# Patient Record
Sex: Female | Born: 1955
Health system: Southern US, Community
[De-identification: ages and names within clinical notes are randomized; demographics above are authoritative.]

## PROBLEM LIST (undated history)

## (undated) DIAGNOSIS — T7840XA Allergy, unspecified, initial encounter: Secondary | ICD-10-CM

## (undated) DIAGNOSIS — K5790 Diverticulosis of intestine, part unspecified, without perforation or abscess without bleeding: Secondary | ICD-10-CM

## (undated) DIAGNOSIS — M1990 Unspecified osteoarthritis, unspecified site: Secondary | ICD-10-CM

## (undated) DIAGNOSIS — R87619 Unspecified abnormal cytological findings in specimens from cervix uteri: Secondary | ICD-10-CM

## (undated) DIAGNOSIS — K635 Polyp of colon: Secondary | ICD-10-CM

## (undated) HISTORY — DX: Diverticulosis of intestine, part unspecified, without perforation or abscess without bleeding: K57.90

## (undated) HISTORY — DX: Unspecified abnormal cytological findings in specimens from cervix uteri: R87.619

## (undated) HISTORY — DX: Allergy, unspecified, initial encounter: T78.40XA

## (undated) HISTORY — DX: Polyp of colon: K63.5

## (undated) HISTORY — PX: COLONOSCOPY: SHX174

---

## 1979-06-23 HISTORY — PX: AUGMENTATION MAMMAPLASTY: SUR837

## 1984-06-22 DIAGNOSIS — R87619 Unspecified abnormal cytological findings in specimens from cervix uteri: Secondary | ICD-10-CM

## 1984-06-22 HISTORY — DX: Unspecified abnormal cytological findings in specimens from cervix uteri: R87.619

## 1993-06-22 HISTORY — PX: ABDOMINAL HYSTERECTOMY: SHX81

## 2006-11-29 ENCOUNTER — Ambulatory Visit: Payer: Self-pay | Admitting: Internal Medicine

## 2006-12-13 ENCOUNTER — Ambulatory Visit: Payer: Self-pay | Admitting: Internal Medicine

## 2006-12-13 ENCOUNTER — Encounter: Payer: Self-pay | Admitting: Internal Medicine

## 2011-10-16 ENCOUNTER — Encounter: Payer: Self-pay | Admitting: Internal Medicine

## 2011-12-01 ENCOUNTER — Other Ambulatory Visit: Payer: Self-pay | Admitting: Obstetrics and Gynecology

## 2012-01-26 ENCOUNTER — Other Ambulatory Visit: Payer: Self-pay | Admitting: Urology

## 2012-02-25 ENCOUNTER — Encounter (HOSPITAL_COMMUNITY): Payer: Self-pay | Admitting: Pharmacy Technician

## 2012-03-01 ENCOUNTER — Telehealth: Payer: Self-pay | Admitting: Internal Medicine

## 2012-03-01 DIAGNOSIS — Z1211 Encounter for screening for malignant neoplasm of colon: Secondary | ICD-10-CM

## 2012-03-01 MED ORDER — PEG-KCL-NACL-NASULF-NA ASC-C 100 G PO SOLR: 1.0000 | Freq: Once | ORAL | Status: DC

## 2012-03-01 NOTE — Telephone Encounter (Signed)
Pt aware and scheduled for colon 03/03/12@8 :30am. Pt to come in for procedure instructions this afternoon.

## 2012-03-01 NOTE — Telephone Encounter (Signed)
Pt was due for colon recall in June. Pt is scheduled to have surgery 9/17 to repair her collapsed vaginal wall and a bladder tack. Pt states her PCP mentioned to her that she should have her colon done prior this surgery. Pt wanted to see if Dr. Marina Goodell thought she should have the colon prior to the procedure to wait until after the surgery. Dr. Marina Goodell please advise.

## 2012-03-01 NOTE — Telephone Encounter (Signed)
Probably best prior to surgery, especially if she is due for followup

## 2012-03-01 NOTE — Addendum Note (Signed)
Addended by: Selinda Michaels R on: 03/01/2012 01:58 PM   Modules accepted: Orders

## 2012-03-01 NOTE — Telephone Encounter (Signed)
Pt came in for prep instruction for colon, moviprep rx sent to pharmacy.

## 2012-03-02 ENCOUNTER — Encounter (HOSPITAL_COMMUNITY)
Admission: RE | Admit: 2012-03-02 | Discharge: 2012-03-02 | Disposition: A | Discharge status: Home / Self Care | Payer: BC Managed Care – PPO | Source: Ambulatory Visit | Attending: Urology | Admitting: Urology

## 2012-03-02 ENCOUNTER — Encounter (HOSPITAL_COMMUNITY): Payer: Self-pay

## 2012-03-02 HISTORY — DX: Unspecified osteoarthritis, unspecified site: M19.90

## 2012-03-02 LAB — CBC
Hemoglobin: 13.4 g/dL (ref 12.0–15.0)
MCH: 30 pg (ref 26.0–34.0)
MCHC: 33.4 g/dL (ref 30.0–36.0)
Platelets: 221 10*3/uL (ref 150–400)
RDW: 12.8 % (ref 11.5–15.5)

## 2012-03-02 LAB — SURGICAL PCR SCREEN
MRSA, PCR: NEGATIVE
Staphylococcus aureus: NEGATIVE

## 2012-03-02 LAB — PROTIME-INR: Prothrombin Time: 13.2 seconds (ref 11.6–15.2)

## 2012-03-02 NOTE — Progress Notes (Signed)
Abnormal PTT faxed to Dr. Sherron Monday - confirmation recieved

## 2012-03-02 NOTE — Patient Instructions (Addendum)
19 Karen Todd  03/02/2012   Your procedure is scheduled on: 03/08/12 AT 7:30 AM  Report to SHORT STAY DEPT  At  5:30  AM.  Call this number if you have problems the morning of surgery: [phone]   Remember:   Do not eat food or drink liquids AFTER MIDNIGHT    Take these medicines the morning of surgery with A SIP OF WATER:   Do not wear jewelry, make-up or nail polish.  Do not wear lotions, powders, or perfumes.   Do not shave legs or underarms 48 hrs. before surgery (men may shave face)  Do not bring valuables to the hospital.  Contacts, dentures or bridgework may not be worn into surgery.  Leave suitcase in the car. After surgery it may be brought to your room.  For patients admitted to the hospital, checkout time is 11:00 AM the day of discharge.   Patients discharged the day of surgery will not be allowed to drive home. If going home same day of surgery, must have someone stay with you first 24 hrs at home and arrange for some one to drive you home from hospital.    Special Instructions:   Please read over the following fact sheets that you were given: MRSA  Information               SHOWER WITH BETASEPT THE NIGHT BEFORE SURGERY AND THE MORNING OF SURGERY             FLEET ENEMA THE NIGHT BEFORE SURGERY         X_______________________________________________________________

## 2012-03-03 ENCOUNTER — Encounter: Payer: Self-pay | Admitting: Internal Medicine

## 2012-03-03 ENCOUNTER — Ambulatory Visit (AMBULATORY_SURGERY_CENTER): Payer: BC Managed Care – PPO | Admitting: Internal Medicine

## 2012-03-03 VITALS — BP 127/79 | HR 73 | Temp 98.0°F | Resp 16 | Ht 65.0 in | Wt 180.0 lb

## 2012-03-03 DIAGNOSIS — D126 Benign neoplasm of colon, unspecified: Secondary | ICD-10-CM

## 2012-03-03 DIAGNOSIS — Z1211 Encounter for screening for malignant neoplasm of colon: Secondary | ICD-10-CM

## 2012-03-03 DIAGNOSIS — Z86010 Personal history of colonic polyps: Secondary | ICD-10-CM

## 2012-03-03 MED ORDER — SODIUM CHLORIDE 0.9 % IV SOLN: 500.0000 mL | INTRAVENOUS | Status: DC

## 2012-03-03 NOTE — Progress Notes (Addendum)
Patient did not have preoperative order for IV antibiotic SSI prophylaxis. (G8918)  Patient did not experience any of the following events: a burn prior to discharge; a fall within the facility; wrong site/side/patient/procedure/implant event; or a hospital transfer or hospital admission upon discharge from the facility. (G8907)  

## 2012-03-03 NOTE — Op Note (Signed)
[facility] [address] [place], [address]   COLONOSCOPY PROCEDURE REPORT  PATIENT: Karen Todd, Karen Todd  MR#: [id number] BIRTHDATE: 1956/05/24 , 56  yrs. old GENDER: Female ENDOSCOPIST: Roxy Cedar, MD REFERRED NW:[id number] Program Recall PROCEDURE DATE:  03/03/2012 PROCEDURE:   Colonoscopy with snare polypectomy    x 1 ASA CLASS:   Class II INDICATIONS:high risk screening; patient's personal history of adenomatous colon polyps. Index 11-2006 w/ TA MEDICATIONS: MAC sedation, administered by CRNA and propofol (Diprivan) 300mg  IV  DESCRIPTION OF PROCEDURE:   After the risks benefits and alternatives of the procedure were thoroughly explained, informed consent was obtained.  A digital rectal exam revealed no abnormalities of the rectum.   The LB CF-[id number]  endoscope was introduced through the anus and advanced to the cecum, which was identified by both the appendix and ileocecal valve. No adverse events experienced.   The quality of the prep was good, using MoviPrep  The instrument was then slowly withdrawn as the colon was fully examined.      COLON FINDINGS: A diminutive polyp was found in the descending colon.  A polypectomy was performed with a cold snare.  The resection was complete and the polyp tissue was completely retrieved.   Moderate diverticulosis was noted in the descending colon and sigmoid colon.   The colon mucosa was otherwise normal. Retroflexed views revealed internal hemorrhoids. The time to cecum=3 minutes 36 seconds.  Withdrawal time=10 minutes 37 seconds. The scope was withdrawn and the procedure completed. COMPLICATIONS: There were no complications.  ENDOSCOPIC IMPRESSION: 1.   Diminutive polyp was found in the descending colon; polypectomy was performed with a cold snare 2.   Moderate diverticulosis was noted in the descending colon and sigmoid colon 3.   The colon mucosa was otherwise normal  RECOMMENDATIONS: 1.  Follow up colonoscopy in 5 years   eSigned:  Roxy Cedar, MD 03/03/2012 9:32 AM   cc: Geoffry Paradise, MD and The Patient   PATIENT NAME:  Karen Todd, Karen Todd MR#: [id number]

## 2012-03-03 NOTE — Patient Instructions (Addendum)
YOU HAD AN ENDOSCOPIC PROCEDURE TODAY AT THE [facility]: Refer to the procedure report that was given to you for any specific questions about what was found during the examination.  If the procedure report does not answer your questions, please call your gastroenterologist to clarify.  If you requested that your care partner not be given the details of your procedure findings, then the procedure report has been included in a sealed envelope for you to review at your convenience later.  YOU SHOULD EXPECT: Some feelings of bloating in the abdomen. Passage of more gas than usual.  Walking can help get rid of the air that was put into your GI tract during the procedure and reduce the bloating. If you had a lower endoscopy (such as a colonoscopy or flexible sigmoidoscopy) you may notice spotting of blood in your stool or on the toilet paper. If you underwent a bowel prep for your procedure, then you may not have a normal bowel movement for a few days.  DIET: Your first meal following the procedure should be a light meal and then it is ok to progress to your normal diet.  A half-sandwich or bowl of soup is an example of a good first meal.  Heavy or fried foods are harder to digest and may make you feel nauseous or bloated.  Likewise meals heavy in dairy and vegetables can cause extra gas to form and this can also increase the bloating.  Drink plenty of fluids but you should avoid alcoholic beverages for 24 hours.  ACTIVITY: Your care partner should take you home directly after the procedure.  You should plan to take it easy, moving slowly for the rest of the day.  You can resume normal activity the day after the procedure however you should NOT DRIVE or use heavy machinery for 24 hours (because of the sedation medicines used during the test).    SYMPTOMS TO REPORT IMMEDIATELY: A gastroenterologist can be reached at any hour.  During normal business hours, 8:30 AM to 5:00 PM Monday through Friday,  call [phone].  After hours and on weekends, please call the GI answering service at [phone] who will take a message and have the physician on call contact you.   Following lower endoscopy (colonoscopy or flexible sigmoidoscopy):  Excessive amounts of blood in the stool  Significant tenderness or worsening of abdominal pains  Swelling of the abdomen that is new, acute  Fever of 100F or higher  FOLLOW UP: If any biopsies were taken you will be contacted by phone or by letter within the next 1-3 weeks.  Call your gastroenterologist if you have not heard about the biopsies in 3 weeks.  Our staff will call the home number listed on your records the next business day following your procedure to check on you and address any questions or concerns that you may have at that time regarding the information given to you following your procedure. This is a courtesy call and so if there is no answer at the home number and we have not heard from you through the emergency physician on call, we will assume that you have returned to your regular daily activities without incident.  SIGNATURES/CONFIDENTIALITY: You and/or your care partner have signed paperwork which will be entered into your electronic medical record.  These signatures attest to the fact that that the information above on your After Visit Summary has been reviewed and is understood.  Full responsibility of the confidentiality of this   discharge information lies with you and/or your care-partner.   Thank-you for choosing us for your healthcare needs. 

## 2012-03-04 ENCOUNTER — Telehealth: Payer: Self-pay | Admitting: *Deleted

## 2012-03-04 NOTE — Telephone Encounter (Signed)
  Follow up Call-  Call back number 03/03/2012  Post procedure Call Back phone  # [phone]  Permission to leave phone message Yes     Patient questions:  Do you have a fever, pain , or abdominal swelling? no Pain Score  0 *  Have you tolerated food without any problems? yes  Have you been able to return to your normal activities? yes  Do you have any questions about your discharge instructions: Diet   no Medications  no Follow up visit  no  Do you have questions or concerns about your Care? no  Actions: * If pain score is 4 or above: No action needed, pain <4.

## 2012-03-07 MED ORDER — GENTAMICIN SULFATE 40 MG/ML IJ SOLN
400.0000 mg | INTRAVENOUS | Status: DC
  Filled 2012-03-07: qty 10

## 2012-03-07 MED ORDER — GENTAMICIN SULFATE 40 MG/ML IJ SOLN
400.0000 mg | INTRAVENOUS | Status: AC
  Administered 2012-03-08: 400 mg via INTRAVENOUS
  Filled 2012-03-07 (×2): qty 10

## 2012-03-07 NOTE — H&P (Signed)
History of Present Illness   [name] has symptomatic pelvic organ prolapse. She leaks with a sneeze and she used to leak with urgency. She has mild frequency and mild nocturia. She has a pinching feeling in her lower abdomen. She leans to her left to double void a small amount. With the vault and posterior wall reduced she had a grade 1 cystocele. She had a moderate grade 2 rectocele and possibly an enterocele. Her vaginal cuff lost 3 or 4 cm of length. She emptied efficiently. She was here to discuss urodynamics. I thought if she had surgery she likely would best benefit from a transvaginal vault suspension, posterior repair and/or enterocele repair. I did not think her pain was likely related to her prolapse.  Review of systems: No change in bowel or neurologic status.   On urodynamics she emptied efficiently and was catheterized for 30 mL. Her maximum capacity was 633 mL. She had mild instability reaching a pressure of 9 cm of water and did not leak. She had mild leakage with a Valsalva leak-point pressure of 65 cm of water. There was moderate leakage at 76 cm of water with the prolapse reduced. During voluntary voiding she was generating some voluntary contractions of low pressure before initiating urination. She then voided 429 mL with a maximum flow of 24 mL per second. Maximum voiding pressure was 28 cm of water. Her contractions were not that well sustained. She had difficulty voiding in the lab setting. She said it was difficult with the vaginal pack in place to void and that the lab setting was more difficult for her. EMG activity increased during the voiding phase. Bladder neck descended 2-3 cm and she had little to no cystocele. The details of the urodynamics are signed and dictated on the urodynamic sheet.    Surgical History Problems  1. History of  Hysterectomy V45.77  Current Meds 1. No Reported Medications  Allergies Medication  1. No Known Drug Allergies  Family  History Problems  1. Paternal history of  Death In The Family Father father passed @ age 35stroke 2. Maternal history of  Death In The Family Mother mother passed @ age 78pneumonia 3. Family history of  Family Health Status Number Of Children 1 son1 daughter 4. Paternal history of  Stroke Syndrome V17.1 5. Paternal grandmother's history of  Tuberculosis 1920's  Social History Problems  1. Alcohol Use glass of wine every other week 2. Caffeine Use 2 cups of coffee, tea 3. Marital History - Currently Married 4. Never A Smoker 5. 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 History of  Tobacco Use  Assessment Assessed  1. Rectocele 596.89 2. Female Stress Incontinence 625.6  Plan Rectocele (596.89)  1. Follow-up Schedule Surgery Office  Follow-up  Done: 01Aug2013  Discussion/Summary   I drew Karen Todd a picture. I went over watchful waiting versus pessary versus a vault suspension and posterior repair and intraoperative inspection for enterocele.   I drew her a picture and we talked about prolapse surgery in detail. Pros, cons, general surgical and anesthetic risks, and other options including behavioral therapy, pessaries, and watchful waiting were discussed. She understands that prolapse repairs are successful in 80-85% of cases for prolapse symptoms and can recur anteriorly, posteriorly, and/or apically. She understands that in most cases I use a graft and general risks were discussed. Surgical risks were described but not limited to the discussion of injury to neighboring structures including the bowel (with possible life-threatening sepsis and colostomy), bladder, urethra,  vagina (all resulting in further surgery), and ureter (resulting in re-implantation). We talked about injury to nerves/soft tissue leading to debilitating and intractable pelvic, abdominal, and lower extremity pain syndromes and neuropathies. The risks of buttock pain, intractable dyspareunia, and  vaginal narrowing and shortening with sequelae were discussed. Bleeding risks, transfusion rates, and infection were discussed. The risk of persistent, de novo, or worsening bladder and/or bowel incontinence/dysfunction was discussed. The need for CIC was described as well the usual post-operative course. The patient understands that she might not reach her treatment goal and that she might be worse following surgery.  I set up goals regarding abdominal discomfort.   I talked to her about watchful waiting versus behavioral therapy versus sling for her mild stress incontinence.   We talked about a sling in detail. Pros, cons, general surgical and anesthetic risks, and other options including behavioral therapy and watchful waiting were discussed. She understands that slings are generally successful in 90% of cases for stress incontinence, 50% for urge incontinence, and that in a small percentage of cases the incontinence can worsen. The risk of persistent, de novo, or worsening incontinence/dysfunction was discussed. Risks were described but not limited to the discussion of injury to neighboring structures including the bowel (with possible life-threatening sepsis and colostomy), bladder, urethra, vagina (all resulting in further surgery), and ureter (resulting in re-implantation). We also talked about the risk of retention requiring urethrolysis, extrusion requiring revision, and erosion resulting in further surgery. Bleeding risks and transfusion rates and the risk of infection were discussed. The risk of pelvic and abdominal pain syndromes, dyspareunia, and neuropathies were discussed. The need for CIC was described as well the usual post-operative course. The patient understands that she might not reach her treatment goal and that she might be worse following surgery.  She has had a previous bladder suspension and hysterectomy years ago.   Karen Todd currently is not sexually active. She is a little bit  worried about Staph infection and this was discussed with her. She had a suprapubic tube in the past and if she ever had surgery for her stress incontinence I consented her for [place].  She does not wish to proceed with a sling. She normally does not wear a pad. She would like to proceed with the vault suspension and posterior repair.   After a thorough review of the management options for the patient's condition the patient  elected to proceed with surgical therapy as noted above. We have discussed the potential benefits and risks of the procedure, side effects of the proposed treatment, the likelihood of the patient achieving the goals of the procedure, and any potential problems that might occur during the procedure or recuperation. Informed consent has been obtained.

## 2012-03-08 ENCOUNTER — Encounter (HOSPITAL_COMMUNITY): Payer: Self-pay | Admitting: Anesthesiology

## 2012-03-08 ENCOUNTER — Observation Stay (HOSPITAL_COMMUNITY)
Admission: RE | Admit: 2012-03-08 | Discharge: 2012-03-09 | DRG: 356 | Disposition: A | Discharge status: Home / Self Care | Payer: BC Managed Care – PPO | Source: Ambulatory Visit | Attending: Urology | Admitting: Urology

## 2012-03-08 ENCOUNTER — Encounter: Payer: Self-pay | Admitting: Internal Medicine

## 2012-03-08 ENCOUNTER — Ambulatory Visit (HOSPITAL_COMMUNITY): Payer: BC Managed Care – PPO | Admitting: Anesthesiology

## 2012-03-08 ENCOUNTER — Encounter (HOSPITAL_COMMUNITY): Payer: Self-pay | Admitting: *Deleted

## 2012-03-08 ENCOUNTER — Encounter (HOSPITAL_COMMUNITY)
Admission: RE | Disposition: A | Discharge status: Home / Self Care | Payer: Self-pay | Source: Ambulatory Visit | Attending: Urology

## 2012-03-08 DIAGNOSIS — R112 Nausea with vomiting, unspecified: Secondary | ICD-10-CM | POA: Insufficient documentation

## 2012-03-08 DIAGNOSIS — N8110 Cystocele, unspecified: Principal | ICD-10-CM | POA: Insufficient documentation

## 2012-03-08 DIAGNOSIS — N393 Stress incontinence (female) (male): Secondary | ICD-10-CM | POA: Diagnosis present

## 2012-03-08 DIAGNOSIS — N816 Rectocele: Secondary | ICD-10-CM | POA: Insufficient documentation

## 2012-03-08 DIAGNOSIS — Z01812 Encounter for preprocedural laboratory examination: Secondary | ICD-10-CM

## 2012-03-08 DIAGNOSIS — Z9079 Acquired absence of other genital organ(s): Secondary | ICD-10-CM | POA: Insufficient documentation

## 2012-03-08 HISTORY — PX: RECTOCELE REPAIR: SHX761

## 2012-03-08 HISTORY — PX: CYSTOSCOPY: SHX5120

## 2012-03-08 HISTORY — PX: VAGINAL PROLAPSE REPAIR: SHX830

## 2012-03-08 LAB — BASIC METABOLIC PANEL
CO2: 27 mEq/L (ref 19–32)
Calcium: 8.7 mg/dL (ref 8.4–10.5)
Chloride: 99 mEq/L (ref 96–112)
Creatinine, Ser: 0.8 mg/dL (ref 0.50–1.10)
GFR calc Af Amer: >90 mL/min (ref >90)
Sodium: 134 mEq/L — ABNORMAL LOW (ref 135–145)

## 2012-03-08 LAB — ABO/RH: ABO/RH(D): O NEG

## 2012-03-08 LAB — TYPE AND SCREEN

## 2012-03-08 LAB — HEMOGLOBIN AND HEMATOCRIT, BLOOD
HCT: 37.5 % (ref 36.0–46.0)
Hemoglobin: 13 g/dL (ref 12.0–15.0)

## 2012-03-08 SURGERY — CYSTOSCOPY
Anesthesia: General

## 2012-03-08 MED ORDER — SODIUM CHLORIDE 0.9 % IR SOLN
Status: DC | PRN
  Administered 2012-03-08: 08:00:00

## 2012-03-08 MED ORDER — CEFAZOLIN SODIUM-DEXTROSE 2-3 GM-% IV SOLR
INTRAVENOUS | Status: AC
  Filled 2012-03-08: qty 50

## 2012-03-08 MED ORDER — DEXAMETHASONE SODIUM PHOSPHATE 4 MG/ML IJ SOLN
INTRAMUSCULAR | Status: DC | PRN
  Administered 2012-03-08: 10 mg via INTRAVENOUS

## 2012-03-08 MED ORDER — EPHEDRINE SULFATE 50 MG/ML IJ SOLN
INTRAMUSCULAR | Status: DC | PRN
  Administered 2012-03-08 (×2): 5 mg via INTRAVENOUS
  Administered 2012-03-08: 10 mg via INTRAVENOUS

## 2012-03-08 MED ORDER — ONDANSETRON HCL 4 MG/2ML IJ SOLN
INTRAMUSCULAR | Status: DC | PRN
  Administered 2012-03-08 (×2): 2 mg via INTRAVENOUS

## 2012-03-08 MED ORDER — ONDANSETRON HCL 4 MG/2ML IJ SOLN
4.0000 mg | Freq: Four times a day (QID) | INTRAMUSCULAR | Status: DC
  Administered 2012-03-08 – 2012-03-09 (×3): 4 mg via INTRAVENOUS
  Filled 2012-03-08 (×3): qty 2

## 2012-03-08 MED ORDER — HYDROMORPHONE HCL PF 1 MG/ML IJ SOLN: 0.2500 mg | INTRAMUSCULAR | Status: DC | PRN

## 2012-03-08 MED ORDER — ESTRADIOL 0.1 MG/GM VA CREA
TOPICAL_CREAM | VAGINAL | Status: DC | PRN
  Administered 2012-03-08: 1 via VAGINAL

## 2012-03-08 MED ORDER — HYDROCODONE-ACETAMINOPHEN 5-325 MG PO TABS: 1.0000 | ORAL_TABLET | ORAL | Status: DC | PRN

## 2012-03-08 MED ORDER — LIDOCAINE-EPINEPHRINE (PF) 1 %-1:200000 IJ SOLN
INTRAMUSCULAR | Status: AC
  Filled 2012-03-08: qty 20

## 2012-03-08 MED ORDER — HYDROMORPHONE HCL PF 1 MG/ML IJ SOLN
INTRAMUSCULAR | Status: DC | PRN
  Administered 2012-03-08: 0.5 mg via INTRAVENOUS

## 2012-03-08 MED ORDER — MIDAZOLAM HCL 5 MG/5ML IJ SOLN
INTRAMUSCULAR | Status: DC | PRN
  Administered 2012-03-08: 1 mg via INTRAVENOUS

## 2012-03-08 MED ORDER — INDIGOTINDISULFONATE SODIUM 8 MG/ML IJ SOLN
INTRAMUSCULAR | Status: AC
  Filled 2012-03-08: qty 10

## 2012-03-08 MED ORDER — KETOROLAC TROMETHAMINE 30 MG/ML IJ SOLN: 15.0000 mg | Freq: Once | INTRAMUSCULAR | Status: DC | PRN

## 2012-03-08 MED ORDER — ACETAMINOPHEN 10 MG/ML IV SOLN
INTRAVENOUS | Status: AC
  Filled 2012-03-08: qty 100

## 2012-03-08 MED ORDER — GLYCOPYRROLATE 0.2 MG/ML IJ SOLN
INTRAMUSCULAR | Status: DC | PRN
  Administered 2012-03-08: 0.4 mg via INTRAVENOUS

## 2012-03-08 MED ORDER — LACTATED RINGERS IV SOLN
INTRAVENOUS | Status: DC | PRN
  Administered 2012-03-08 (×2): via INTRAVENOUS

## 2012-03-08 MED ORDER — KCL IN DEXTROSE-NACL 10-5-0.45 MEQ/L-%-% IV SOLN
INTRAVENOUS | Status: DC
  Administered 2012-03-08 – 2012-03-09 (×3): via INTRAVENOUS
  Filled 2012-03-08 (×5): qty 1000

## 2012-03-08 MED ORDER — CEFAZOLIN SODIUM-DEXTROSE 2-3 GM-% IV SOLR
2.0000 g | INTRAVENOUS | Status: AC
  Administered 2012-03-08: 2 g via INTRAVENOUS

## 2012-03-08 MED ORDER — PROPOFOL 10 MG/ML IV EMUL
INTRAVENOUS | Status: DC | PRN
  Administered 2012-03-08: 200 mg via INTRAVENOUS

## 2012-03-08 MED ORDER — LIDOCAINE HCL (CARDIAC) 20 MG/ML IV SOLN
INTRAVENOUS | Status: DC | PRN
  Administered 2012-03-08: 30 mg via INTRAVENOUS

## 2012-03-08 MED ORDER — CISATRACURIUM BESYLATE (PF) 10 MG/5ML IV SOLN
INTRAVENOUS | Status: DC | PRN
  Administered 2012-03-08: 1 mg via INTRAVENOUS
  Administered 2012-03-08: 6 mg via INTRAVENOUS
  Administered 2012-03-08: 1 mg via INTRAVENOUS

## 2012-03-08 MED ORDER — FLEET ENEMA 7-19 GM/118ML RE ENEM: 1.0000 | ENEMA | Freq: Once | RECTAL | Status: DC

## 2012-03-08 MED ORDER — MORPHINE SULFATE 2 MG/ML IJ SOLN
2.0000 mg | INTRAMUSCULAR | Status: DC | PRN
  Administered 2012-03-08 (×2): 2 mg via INTRAVENOUS
  Filled 2012-03-08 (×2): qty 1

## 2012-03-08 MED ORDER — PROMETHAZINE HCL 25 MG/ML IJ SOLN: 6.2500 mg | INTRAMUSCULAR | Status: DC | PRN

## 2012-03-08 MED ORDER — ACETAMINOPHEN 10 MG/ML IV SOLN
1000.0000 mg | Freq: Four times a day (QID) | INTRAVENOUS | Status: AC
  Administered 2012-03-08 – 2012-03-09 (×4): 1000 mg via INTRAVENOUS
  Filled 2012-03-08 (×4): qty 100

## 2012-03-08 MED ORDER — STERILE WATER FOR IRRIGATION IR SOLN
Status: DC | PRN
  Administered 2012-03-08: 3000 mL via INTRAVESICAL

## 2012-03-08 MED ORDER — LIDOCAINE-EPINEPHRINE (PF) 1 %-1:200000 IJ SOLN
INTRAMUSCULAR | Status: DC | PRN
  Administered 2012-03-08: 18 mL

## 2012-03-08 MED ORDER — FENTANYL CITRATE 0.05 MG/ML IJ SOLN
INTRAMUSCULAR | Status: DC | PRN
  Administered 2012-03-08: 25 ug via INTRAVENOUS
  Administered 2012-03-08: 75 ug via INTRAVENOUS

## 2012-03-08 MED ORDER — INDIGOTINDISULFONATE SODIUM 8 MG/ML IJ SOLN
INTRAMUSCULAR | Status: DC | PRN
  Administered 2012-03-08: 5 mL via INTRAVENOUS

## 2012-03-08 MED ORDER — SUCCINYLCHOLINE CHLORIDE 20 MG/ML IJ SOLN
INTRAMUSCULAR | Status: DC | PRN
  Administered 2012-03-08: 100 mg via INTRAVENOUS

## 2012-03-08 MED ORDER — ESTRADIOL 0.1 MG/GM VA CREA
TOPICAL_CREAM | VAGINAL | Status: AC
Start: 2012-03-08 — End: 2012-03-08
  Filled 2012-03-08: qty 85

## 2012-03-08 MED ORDER — NEOSTIGMINE METHYLSULFATE 1 MG/ML IJ SOLN
INTRAMUSCULAR | Status: DC | PRN
  Administered 2012-03-08: 3 mg via INTRAVENOUS

## 2012-03-08 MED ORDER — ACETAMINOPHEN 10 MG/ML IV SOLN
INTRAVENOUS | Status: DC | PRN
  Administered 2012-03-08: 1000 mg via INTRAVENOUS

## 2012-03-08 SURGICAL SUPPLY — 59 items
ADH SKN CLS APL DERMABOND .7 (GAUZE/BANDAGES/DRESSINGS)
BAG URINE DRAINAGE (UROLOGICAL SUPPLIES) ×2 IMPLANT
BAG URO CATCHER STRL LF (DRAPE) ×1 IMPLANT
BLADE HEX COATED 2.75 (ELECTRODE) ×2 IMPLANT
BLADE SURG 15 STRL LF DISP TIS (BLADE) ×2 IMPLANT
BLADE SURG 15 STRL SS (BLADE) ×4
BRIEF STRETCH FOR OB PAD LRG (UNDERPADS AND DIAPERS) ×1 IMPLANT
CATH FOLEY 2WAY SLVR  5CC 14FR (CATHETERS) ×1
CATH FOLEY 2WAY SLVR  5CC 16FR (CATHETERS)
CATH FOLEY 2WAY SLVR 5CC 14FR (CATHETERS) ×1 IMPLANT
CATH FOLEY 2WAY SLVR 5CC 16FR (CATHETERS) ×1 IMPLANT
CLOTH BEACON ORANGE TIMEOUT ST (SAFETY) ×2 IMPLANT
COVER MAYO STAND STRL (DRAPES) IMPLANT
COVER SURGICAL LIGHT HANDLE (MISCELLANEOUS) ×2 IMPLANT
DECANTER SPIKE VIAL GLASS SM (MISCELLANEOUS) ×2 IMPLANT
DERMABOND ADVANCED (GAUZE/BANDAGES/DRESSINGS)
DERMABOND ADVANCED .7 DNX12 (GAUZE/BANDAGES/DRESSINGS) IMPLANT
DEVICE CAPIO SUTURING (INSTRUMENTS) ×1
DEVICE CAPIO SUTURING OPC (INSTRUMENTS) IMPLANT
DRAIN PENROSE 18X1/4 LTX STRL (WOUND CARE) ×1 IMPLANT
DRAPE CAMERA CLOSED 9X96 (DRAPES) ×2 IMPLANT
DRAPE LG THREE QUARTER DISP (DRAPES) ×2 IMPLANT
GAUZE PACKING 2X5 YD STERILE (GAUZE/BANDAGES/DRESSINGS) ×3 IMPLANT
GAUZE SPONGE 4X4 16PLY XRAY LF (GAUZE/BANDAGES/DRESSINGS) ×5 IMPLANT
GLOVE BIOGEL M STRL SZ7.5 (GLOVE) ×4 IMPLANT
GOWN PREVENTION PLUS XLARGE (GOWN DISPOSABLE) ×3 IMPLANT
GOWN STRL REIN XL XLG (GOWN DISPOSABLE) ×5 IMPLANT
HOLDER FOLEY CATH W/STRAP (MISCELLANEOUS) ×2 IMPLANT
KIT BASIN OR (CUSTOM PROCEDURE TRAY) ×2 IMPLANT
NDL MAYO 6 CRC TAPER PT (NEEDLE) ×1 IMPLANT
NEEDLE HYPO 22GX1.5 SAFETY (NEEDLE) ×1 IMPLANT
NEEDLE INJECT RIGID (NEEDLE) IMPLANT
NEEDLE INJECT RIGID 21GA 14.6 (NEEDLE) IMPLANT
NEEDLE MAYO .5 CIRCLE (NEEDLE) ×1 IMPLANT
NEEDLE MAYO 6 CRC TAPER PT (NEEDLE) IMPLANT
PACK CYSTO (CUSTOM PROCEDURE TRAY) ×2 IMPLANT
PENCIL BUTTON HOLSTER BLD 10FT (ELECTRODE) ×2 IMPLANT
PLUG CATH AND CAP STER (CATHETERS) ×2 IMPLANT
RETRACTOR STAY HOOK 5MM (MISCELLANEOUS) ×1 IMPLANT
SHEET LAVH (DRAPES) ×2 IMPLANT
SUT CAPIO ETHIBPND (SUTURE) ×2 IMPLANT
SUT CAPIO POLYGLYCOLIC (SUTURE) IMPLANT
SUT ETHIBOND 0 (SUTURE) ×2 IMPLANT
SUT SILK 2 0 SH (SUTURE) ×1 IMPLANT
SUT VIC AB 0 CT1 27 (SUTURE) ×2
SUT VIC AB 0 CT1 27XBRD ANTBC (SUTURE) ×2 IMPLANT
SUT VIC AB 2-0 CT1 27 (SUTURE) ×2
SUT VIC AB 2-0 CT1 27XBRD (SUTURE) ×2 IMPLANT
SUT VIC AB 2-0 SH 27 (SUTURE) ×10
SUT VIC AB 2-0 SH 27X BRD (SUTURE) ×2 IMPLANT
SUT VIC AB 3-0 SH 27 (SUTURE) ×8
SUT VIC AB 3-0 SH 27XBRD (SUTURE) ×3 IMPLANT
SUT VIC AB 4-0 PS2 27 (SUTURE) ×1 IMPLANT
SUT VICRYL 0 UR6 27IN ABS (SUTURE) ×7 IMPLANT
SYRINGE 10CC LL (SYRINGE) ×2 IMPLANT
TISSUE REPAIR XENFORM 6X10CM (Tissue) ×1 IMPLANT
TUBING CONNECTING 10 (TUBING) ×2 IMPLANT
WATER STERILE IRR 1500ML POUR (IV SOLUTION) ×1 IMPLANT
YANKAUER SUCT BULB TIP 10FT TU (MISCELLANEOUS) ×2 IMPLANT

## 2012-03-08 NOTE — Op Note (Addendum)
Diagnosis: Vault prolapse, rectocele, possible enterocele Post op Diagnosis: Vault prolapse and rectocele Surgery: Vault prolapse repair plus rectocele repair and graft and cystoscopy Surgeon: Dr. Lorin Picket Tytus Strahle Assist: Pecola Leisure  The patient consented to the above procedure with the above diagnoses. Extra care was taken with leg positioning to minimize the risk of compartment syndrome and neuropathy and deep vein thrombosis Preoperative laboratory tests were normal. Preoperative antibiotics were given  Loss of vaginal length and a posterior apical defect was identified in keeping with a possible enterocele though was likely just a rectocele. She had no rectocele in the distal third of the vagina and almost a shelf at 6:00 at the level of the introitus. It was somewhat difficult to see a minute hymenal ring  2 Allis clamps were placed on the hymenal ring and a small triangle of perineal skin was removed. I had instilled 20 cc of a lidocaine epinephrine mixture. I made a long posterior vaginal wall incision all the way to the apex marked with a 3-0 Vicryl suture. The bulging at the apex was quite significant larger than a cough ball.  It was easy to sharply and bluntly mobilize the overlying vaginal wall mucosa from the underlying pubocervical fascia and apical defect to the sidewall bilaterally.  I did a rectal examination and a did not think she had an enterocele but she could have. I did very fine sharp dissection at the apex looking for an enterocele opening up one layer of fascia. At this level she a pre-peritoneal fat. She did not have an enterocele and I repeated the rectal examination to confirm no injury and no enterocele. I decided to close the thin fascia with 3-0 Vicryl.  I sharply and bluntly dissected at the vaginal apex opening the pararectal gutters bilaterally down to the initial spine feeling a sacrospinous ligament bilaterally. Using the Capio device I placed a 0 Ethibond  suture 1 fingerbreadth medial to the initial spine in a straight line between the 2 spines and I was very happy with the position and security of them. I repeated the rectal examination and there is no injury to rectum  I could not do a trapdoor repair with the rectovaginal fascia over the apical defect since I felt it would cause severe vaginal shortening. Instead I imbricated the rectovaginal fascia in the midline near the apex making the apical bulge smaller and reducing her small mid rectocele. I brought the posterior repair to approximately 2 cm from the introitus. I did a second layer for strength. I then imbricated the small apical bulge similarly and it reduced beautifully.  Rectal examination was repeated and there was no distortion of rectum or injury  The 10 x 6 dermal graft was cut in the shape of a trapezoid. It was sutured down to the ligament bilaterally. Distally it was sewn to the rectovaginal fascia with 0 Vicryl.  Only a few millimeters of posterior vaginal wall was trimmed. I closed the posterior vaginal wall with running 0 Vicryl on a CT1 needle. I did 1 gentle perineal suture with 0 Vicryl. I used the same running 2-0 Vicryl to close the perineum  The scope the patient and was excellent blue jets bilaterally with no distortion of bladder or urethra or trigone  Blood loss was less than 50 mL. She had excellent vaginal length at the end of the case. Vaginal pack with Estrace cream was applied  His was again but I was very pleased with the end result and strength of  the repair. The graft laid over the apex posteriorly very well

## 2012-03-08 NOTE — Anesthesia Preprocedure Evaluation (Signed)

## 2012-03-08 NOTE — Progress Notes (Signed)
Vitals normal Laboratory tests pending Patient alert and stable Pain minimal and well-controlled Bit of vaginal pressure- no nerve pain- no tummy pain- no more nausea

## 2012-03-08 NOTE — Anesthesia Postprocedure Evaluation (Signed)
  Anesthesia Post-op Note  Patient: Karen Todd  Procedure(s) Performed: Procedure(s) (LRB): CYSTOSCOPY (N/A) POSTERIOR REPAIR (RECTOCELE) (N/A) VAGINAL VAULT SUSPENSION (N/A)  Patient Location: PACU  Anesthesia Type: General  Level of Consciousness: awake and alert   Airway and Oxygen Therapy: Patient Spontanous Breathing  Post-op Pain: mild  Post-op Assessment: Post-op Vital signs reviewed, Patient's Cardiovascular Status Stable, Respiratory Function Stable, Patent Airway and No signs of Nausea or vomiting  Post-op Vital Signs: stable  Complications: No apparent anesthesia complications

## 2012-03-08 NOTE — Interval H&P Note (Signed)
History and Physical Interval Note:  03/08/2012 7:17 AM  Karen Todd  has presented today for surgery, with the diagnosis of Rectocele/Vault Prolapse/Enterocele  The various methods of treatment have been discussed with the patient and family. After consideration of risks, benefits and other options for treatment, the patient has consented to  Procedure(s) (LRB) with comments: CYSTOSCOPY (N/A) POSTERIOR REPAIR (RECTOCELE) (N/A) - Enterocele  VAGINAL VAULT SUSPENSION (N/A) - with Graft as a surgical intervention .  The patient's history has been reviewed, patient examined, no change in status, stable for surgery.  I have reviewed the patient's chart and labs.  Questions were answered to the patient's satisfaction.     Argentina Kosch A

## 2012-03-08 NOTE — Progress Notes (Signed)
Pt. Was given and taught about incentive spirometer.  Goal was to reach 1000 and goal was achieved.

## 2012-03-08 NOTE — Transfer of Care (Signed)
Immediate Anesthesia Transfer of Care Note  Patient: Karen Todd  Procedure(s) Performed: Procedure(s) (LRB) with comments: CYSTOSCOPY (N/A) POSTERIOR REPAIR (RECTOCELE) (N/A) VAGINAL VAULT SUSPENSION (N/A) - with Graft  Patient Location: PACU  Anesthesia Type: General  Level of Consciousness: awake and alert   Airway & Oxygen Therapy: Patient Spontanous Breathing and Patient connected to face mask oxygen  Post-op Assessment: Report given to PACU RN  Post vital signs: Reviewed and stable  Complications: No apparent anesthesia complications

## 2012-03-08 NOTE — Progress Notes (Signed)
Day of Surgery Subjective: Patient currently resting comfortably. She had one episode of emesis after arrival in room from PACU.  She states the motion from transport caused the N/V.  She now feels well.    She also states that her N/V reaction to codeine was "years ago" and she does not know a specific medication that caused it.    Objective: Vital signs in last 24 hours: Temp:  [97.4 F (36.3 C)-98.1 F (36.7 C)] 97.5 F (36.4 C) (09/17 1215) Pulse Rate:  [54-83] 66  (09/17 1215) Resp:  [8-18] 16  (09/17 1215) BP: (112-139)/(58-77) 123/66 mmHg (09/17 1215) SpO2:  [96 %-100 %] 98 % (09/17 1215)  Intake/Output from previous day:   Intake/Output this shift: Total I/O In: 1650 [I.V.:1650] Out: 270 [Urine:250; Other:10; Blood:10]  Physical Exam:  General:alert, cooperative and no distress Cardiac: RRR Abd: soft; NT Urine: yellow  Lab Results: No results found for this basename: HGB:3,HCT:3 in the last 72 hours BMET No results found for this basename: NA:2,K:2,CL:2,CO2:2,GLUCOSE:2,BUN:2,CREATININE:2,CALCIUM:2 in the last 72 hours No results found for this basename: LABPT:3,INR:3 in the last 72 hours No results found for this basename: LABURIN:1 in the last 72 hours Results for orders placed during the hospital encounter of 03/02/12  SURGICAL PCR SCREEN     Status: Normal   Collection Time   03/02/12  8:41 AM      Component Value Range Status Comment   MRSA, PCR NEGATIVE  NEGATIVE Final    Staphylococcus aureus NEGATIVE  NEGATIVE Final     Studies/Results: No results found.  Assessment/Plan: Day of Surgery Procedure(s) (LRB): CYSTOSCOPY (N/A) POSTERIOR REPAIR (RECTOCELE) (N/A) VAGINAL VAULT SUSPENSION (N/A)  Doing well post op; one episode emesis and sx have improved.  Current to monitor   LOS: 0 days   Silas Flood. 03/08/2012, 1:26 PM

## 2012-03-09 ENCOUNTER — Encounter (HOSPITAL_COMMUNITY): Payer: Self-pay | Admitting: Urology

## 2012-03-09 LAB — BASIC METABOLIC PANEL
BUN: 11 mg/dL (ref 6–23)
Chloride: 101 mEq/L (ref 96–112)
Creatinine, Ser: 0.79 mg/dL (ref 0.50–1.10)
GFR calc Af Amer: >90 mL/min (ref >90)
GFR calc non Af Amer: >90 mL/min (ref >90)

## 2012-03-09 LAB — HEMOGLOBIN AND HEMATOCRIT, BLOOD: Hemoglobin: 12.1 g/dL (ref 12.0–15.0)

## 2012-03-09 MED ORDER — HYDROCODONE-ACETAMINOPHEN 5-500 MG PO TABS: 1.0000 | ORAL_TABLET | Freq: Four times a day (QID) | ORAL | Status: DC | PRN

## 2012-03-09 MED ORDER — CIPROFLOXACIN HCL 250 MG PO TABS: 250.0000 mg | ORAL_TABLET | Freq: Two times a day (BID) | ORAL | Status: DC

## 2012-03-09 NOTE — Discharge Summary (Signed)
Date of admission: 03/08/2012  Date of discharge: 03/09/2012  Admission diagnosis: Rectocele  Discharge diagnosis: Rectocele  Secondary diagnoses: Vault prolaspe  History and Physical: For full details, please see admission history and physical. Briefly, Karen Todd is a 56 y.o. year old patient with rectocle and vault prolapse.    Hospital Course: She had uneventful surgery as noted and a very good post op course. Some nausea which settled. All labs normal. All vitals normal  Laboratory values:  Basename 03/09/12 0325 03/08/12 1555  HGB 12.1 13.0  HCT 35.6* 37.5    Basename 03/09/12 0325 03/08/12 1555  CREATININE 0.79 0.80    Disposition: Home  Discharge instruction: The patient was instructed to be ambulatory but told to refrain from heavy lifting, strenuous activity, or driving. Discussed  Discharge medications:    Medication List     As of 03/09/2012  8:15 AM    START taking these medications         ciprofloxacin 250 MG tablet   Commonly known as: CIPRO   Take 1 tablet (250 mg total) by mouth 2 (two) times daily.      HYDROcodone-acetaminophen 5-500 MG per tablet   Commonly known as: VICODIN   Take 1-2 tablets by mouth every 6 (six) hours as needed for pain.          Where to get your medications    These are the prescriptions that you need to pick up.   You may get these medications from any pharmacy.         ciprofloxacin 250 MG tablet   HYDROcodone-acetaminophen 5-500 MG per tablet            Followup:

## 2012-03-09 NOTE — Progress Notes (Signed)
Vitals normal Laboratory tests normal Patient alert and stable Pain minimal and well-controlled Post treatment course discussed in detail Followup discussed in detail See orders  Discussed re sling not being done as chosen pre op by patient

## 2012-03-09 NOTE — Progress Notes (Signed)
Foley catheter and vaginal packing removed as ordered this am by Dr Sherron Monday. No complications encountered. Will notify/inform him of void post cath removal as ordered also.

## 2012-03-09 NOTE — Progress Notes (Signed)
#  14 French foley catheter inserted as ordered by Dr MacDiarmidis due to urinary retention. PVR ranging from 297 to 437. Pt for d/c home this afternoon. Instructed on leg bag, larger gravity bag for bedtime use and also how to empty cath. Md office Rn to call pt on tomorrow and pt to follow up in office on Fri. Will inform/instruct pt.

## 2012-03-09 NOTE — Progress Notes (Signed)
PVR bladder scan=297 ml after voiding . Will continue voiding trial and inform MD.

## 2012-04-19 ENCOUNTER — Other Ambulatory Visit: Payer: Self-pay | Admitting: Dermatology

## 2012-06-02 ENCOUNTER — Other Ambulatory Visit: Payer: Self-pay | Admitting: Dermatology

## 2012-08-31 ENCOUNTER — Other Ambulatory Visit: Payer: Self-pay | Admitting: Dermatology

## 2014-01-04 ENCOUNTER — Other Ambulatory Visit: Payer: Self-pay | Admitting: Dermatology

## 2014-02-14 ENCOUNTER — Other Ambulatory Visit: Payer: Self-pay | Admitting: Dermatology

## 2014-02-15 ENCOUNTER — Encounter: Payer: Self-pay | Admitting: Obstetrics and Gynecology

## 2014-04-05 ENCOUNTER — Encounter: Payer: Self-pay | Admitting: Obstetrics and Gynecology

## 2014-04-05 ENCOUNTER — Ambulatory Visit (INDEPENDENT_AMBULATORY_CARE_PROVIDER_SITE_OTHER): Payer: BC Managed Care – PPO | Admitting: Obstetrics and Gynecology

## 2014-04-05 VITALS — BP 120/78 | HR 80 | Resp 14 | Ht 66.0 in | Wt 193.8 lb

## 2014-04-05 DIAGNOSIS — R1031 Right lower quadrant pain: Secondary | ICD-10-CM

## 2014-04-05 DIAGNOSIS — Z0000 Encounter for general adult medical examination without abnormal findings: Secondary | ICD-10-CM

## 2014-04-05 DIAGNOSIS — Z01419 Encounter for gynecological examination (general) (routine) without abnormal findings: Secondary | ICD-10-CM

## 2014-04-05 LAB — POCT URINALYSIS DIPSTICK
BILIRUBIN UA: NEGATIVE
Blood, UA: NEGATIVE
GLUCOSE UA: NEGATIVE
Ketones, UA: NEGATIVE
LEUKOCYTES UA: NEGATIVE
NITRITE UA: NEGATIVE
Protein, UA: NEGATIVE
Urobilinogen, UA: NEGATIVE
pH, UA: 5

## 2014-04-05 NOTE — Patient Instructions (Signed)

## 2014-04-05 NOTE — Progress Notes (Signed)
Patient ID: Karen Todd, female   DOB: [birth year], 58 y.o.   MRN: [id number] 58 y.o. [id number] MarriedCaucasianF here for annual exam.    Status post rectocele repair with a biological graft and vaginal vault suspension.  Not very sexually active.   Some pelvic twinges.  Some right sided abdominal pain.  Voiding well.  Bowel movements are normal.  Lifted a bench and wonders if she has a hernia.  Some leakage from bladder if she waits too long.   No LMP recorded. Patient has had a hysterectomy.          Sexually active: No.  The current method of family planning is hysterectomy.   Exercising: Yes.    walking. Smoker:  no  Health Maintenance: Pap:  12-01-11 wnl History of abnormal Pap:  Yes--1985 hx of cryotherapy to cervix for CIN II and CIN III. MMG:  2014 ELF:YBOFB Colonoscopy:  02/2012 colon polyps with Dr. Scarlette Shorts.  Next colonoscopy due 02/2017. BMD:   - - TDaP:  Up to date with PCP Screening Labs: PCP, Hb today: PCP, Urine today: Neg   reports that she has never smoked. She has never used smokeless tobacco. She reports that she drinks about one ounce of alcohol per week. She reports that she does not use illicit drugs.  Past Medical History  Diagnosis Date  . Diverticulosis   . Colon polyps     adenomatous  . Arthritis   . Abnormal Pap smear of cervix 1986    --hx cryotherapy to cervix for CIN II and CIN III    Past Surgical History  Procedure Laterality Date  . Cystoscopy  03/08/2012    Procedure: CYSTOSCOPY;  Surgeon: Reece Packer, MD;  Location: [facility];  Service: Urology;  Laterality: N/A;  . Rectocele repair  03/08/2012    Procedure: POSTERIOR REPAIR (RECTOCELE);  Surgeon: Reece Packer, MD;  Location: [facility];  Service: Urology;  Laterality: N/A;  . Vaginal prolapse repair  03/08/2012    Procedure: VAGINAL VAULT SUSPENSION;  Surgeon: Reece Packer, MD;  Location: [facility];  Service: Urology;  Laterality: N/A;  with Graft  . Abdominal hysterectomy  1995     TVH--ovaries remain  . Augmentation mammaplasty  5102    silicone    No current outpatient prescriptions on file.   No current facility-administered medications for this visit.    Family History  Problem Relation Age of Onset  . Thyroid disease Mother     goiter  . Stroke Father   . Heart attack Father   . Hypertension Brother   . Heart disease Maternal Grandfather     ROS:  Pertinent items are noted in HPI.  Otherwise, a comprehensive ROS was negative.  Exam:   BP 120/78  Pulse 80  Resp 14  Ht 5\' 6"  (1.676 m)  Wt 193 lb 12.8 oz (87.907 kg)  BMI 31.30 kg/m2    Height: 5\' 6"  (167.6 cm)  Ht Readings from Last 3 Encounters:  04/05/14 5\' 6"  (1.676 m)  03/08/12 5\' 5"  (1.651 m)  03/08/12 5\' 5"  (1.651 m)    General appearance: alert, cooperative and appears stated age Head: Normocephalic, without obvious abnormality, atraumatic Neck: no adenopathy, supple, symmetrical, trachea midline and thyroid normal to inspection and palpation Lungs: clear to auscultation bilaterally Breasts: normal appearance, no masses or tenderness, No nipple retraction or dimpling, No nipple discharge or bleeding, No axillary or supraclavicular adenopathy, Normal to palpation without dominant masses,  Bilateral implants palpable.  Heart: regular rate and rhythm Abdomen: soft, non-tender; bowel sounds normal; no masses,  no organomegaly Extremities: extremities normal, atraumatic, no cyanosis or edema Skin: Skin color, texture, turgor normal. No rashes or lesions Lymph nodes: Cervical, supraclavicular, and axillary nodes normal. No abnormal inguinal nodes palpated Neurologic: Grossly normal   Pelvic: External genitalia:  no lesions              Urethra:  normal appearing urethra with no masses, tenderness or lesions              Bartholins and Skenes: normal                 Vagina: normal appearing vagina with normal color and discharge, no lesions              Cervix: absent              Pap  taken: No. Bimanual Exam:  Uterus:  uterus absent              Adnexa: no mass, fullness, tenderness               Rectovaginal: Confirms               Anus:  normal sphincter tone, no lesions  A:  Well Woman with normal exam RLQ abdominal pain. Status post posterior colporrhaphy with vaginal vault suspension. Remote history of cervical dysplasia. Status post TVH. Ovaries remain. Bilateral breast implants.  P:   Mammogram recommended yearly.  Office will assist in scheduling.  pap smear not indicated.  Return for pelvic ultrasound to check ovaries. return annually or prn  An After Visit Summary was printed and given to the patient.

## 2014-04-06 ENCOUNTER — Telehealth: Payer: Self-pay | Admitting: Obstetrics and Gynecology

## 2014-04-06 NOTE — Telephone Encounter (Signed)
Spoke with patient. Advised that per benefit quote received, she will be responsible to pay $93.08 when she comes in for PUS. Patient agreeable.  Scheduled PUS. Advised patient of 72 hour cancellation policy and $825 cancellation fee. Patient agreeable.

## 2014-04-19 ENCOUNTER — Ambulatory Visit (INDEPENDENT_AMBULATORY_CARE_PROVIDER_SITE_OTHER): Payer: BC Managed Care – PPO | Admitting: Obstetrics and Gynecology

## 2014-04-19 ENCOUNTER — Encounter: Payer: Self-pay | Admitting: Obstetrics and Gynecology

## 2014-04-19 ENCOUNTER — Ambulatory Visit (INDEPENDENT_AMBULATORY_CARE_PROVIDER_SITE_OTHER): Payer: BC Managed Care – PPO

## 2014-04-19 VITALS — BP 136/74 | HR 72 | Ht 66.0 in | Wt 195.0 lb

## 2014-04-19 DIAGNOSIS — R1031 Right lower quadrant pain: Secondary | ICD-10-CM

## 2014-04-19 NOTE — Progress Notes (Signed)
Subjective  Patient is here for pelvic ultrasound for RLQ abdominal pain.  States she took some ibuprofen and the pain is now gone.  Was worried about her ovaries.   Status post posterior colporrhaphy with vaginal vault suspension.  Remote history of cervical dysplasia.  Status post TVH. Ovaries remain.  Objective - report and images reviewed with patient.   Absent uterus.  Normal ovaries.  No free fluid.     Assessment   RLQ pain resolved.  Normal ovaries.   Plan  Reassurance given.  Follow up prn.   15 minutes face to face time of which over 50% was spent in counseling.

## 2014-04-19 NOTE — Patient Instructions (Signed)
Your ovaries look normal!

## 2014-04-23 ENCOUNTER — Encounter: Payer: Self-pay | Admitting: Obstetrics and Gynecology

## 2015-05-14 ENCOUNTER — Ambulatory Visit (INDEPENDENT_AMBULATORY_CARE_PROVIDER_SITE_OTHER): Payer: BLUE CROSS/BLUE SHIELD | Admitting: Obstetrics and Gynecology

## 2015-05-14 ENCOUNTER — Encounter: Payer: Self-pay | Admitting: Obstetrics and Gynecology

## 2015-05-14 VITALS — BP 130/82 | HR 72 | Resp 16 | Ht 65.75 in | Wt 195.0 lb

## 2015-05-14 DIAGNOSIS — Z01419 Encounter for gynecological examination (general) (routine) without abnormal findings: Secondary | ICD-10-CM

## 2015-05-14 NOTE — Patient Instructions (Signed)

## 2015-05-14 NOTE — Progress Notes (Signed)
Patient ID: Karen Todd, female   DOB: [birth year], 59 y.o.   MRN: [id number] 59 y.o. [id number] Married Caucasian female here for annual exam.    Discomfort in right lower quadrant. Nothing makes it better or worse.  Lifting maybe makes it worse.  Also some pinching in groin area similar to what she had before prolapse surgery.  Still has ovaries. Had a normal pelvic ultrasound last year.   Occasional leakage urine with sneeze.  No urgency or frequency.  Normal bowel movements.   Mother in law passed away.   PCP:   Dr Reynaldo Minium  No LMP recorded. Patient has had a hysterectomy.          Sexually active: Occ because husband has Prostate cancer The current method of family planning is status post hysterectomy.    Exercising: Yes.    walking yard work Smoker:  no  Health Maintenance: Pap:  12-01-11 WNL  History of abnormal Pap:  Yes 1985 hx of cryotherapy to cervix for CIN II and CIN III MMG:  04-30-15 WNL  Colonoscopy:  03-03-12 polyp repeat in 5 years BMD:   Never TDaP:  Up to date with PCP Screening Labs:  PCP does labs    reports that she has never smoked. She has never used smokeless tobacco. She reports that she drinks about 0.6 oz of alcohol per week. She reports that she does not use illicit drugs.  Past Medical History  Diagnosis Date  . Diverticulosis   . Colon polyps     adenomatous  . Arthritis   . Abnormal Pap smear of cervix 1986    --hx cryotherapy to cervix for CIN II and CIN III    Past Surgical History  Procedure Laterality Date  . Cystoscopy  03/08/2012    Procedure: CYSTOSCOPY;  Surgeon: Reece Packer, MD;  Location: [facility];  Service: Urology;  Laterality: N/A;  . Rectocele repair  03/08/2012    Procedure: POSTERIOR REPAIR (RECTOCELE);  Surgeon: Reece Packer, MD;  Location: [facility];  Service: Urology;  Laterality: N/A;  . Vaginal prolapse repair  03/08/2012    Procedure: VAGINAL VAULT SUSPENSION;  Surgeon: Reece Packer, MD;  Location: [facility];   Service: Urology;  Laterality: N/A;  with Graft  . Abdominal hysterectomy  1995    TVH--ovaries remain  . Augmentation mammaplasty  99991111    silicone    No current outpatient prescriptions on file.   No current facility-administered medications for this visit.    Family History  Problem Relation Age of Onset  . Thyroid disease Mother     goiter  . Stroke Father   . Heart attack Father   . Hypertension Brother   . Heart disease Maternal Grandfather     ROS:  Pertinent items are noted in HPI.  Otherwise, a comprehensive ROS was negative.  Exam:   BP 130/82 mmHg  Pulse 72  Resp 16  Ht 5' 5.75" (1.67 m)  Wt 195 lb (88.451 kg)  BMI 31.72 kg/m2    General appearance: alert, cooperative and appears stated age Head: Normocephalic, without obvious abnormality, atraumatic Neck: no adenopathy, supple, symmetrical, trachea midline and thyroid normal to inspection and palpation Lungs: clear to auscultation bilaterally Breasts: No nipple retraction or dimpling, No nipple discharge or bleeding, No axillary or supraclavicular adenopathy, bilateral implants - firm.  Heart: regular rate and rhythm Abdomen: soft, non-tender; bowel sounds normal; no masses,  no organomegaly Extremities: extremities normal, atraumatic, no cyanosis or edema Skin:  Skin color, texture, turgor normal. No rashes or lesions Lymph nodes: Cervical, supraclavicular, and axillary nodes normal. No abnormal inguinal nodes palpated Neurologic: Grossly normal  Pelvic: External genitalia:  no lesions              Urethra:  normal appearing urethra with no masses, tenderness or lesions              Bartholins and Skenes: normal                 Vagina: normal appearing vagina with normal color and discharge, no lesions              Cervix: absent              Pap taken: No. Bimanual Exam:  Uterus:  uterus absent              Adnexa: normal adnexa and no mass, fullness, tenderness              Rectovaginal: Yes.  .   Confirms.              Anus:  normal sphincter tone, no lesions  Chaperone was present for exam.  Assessment:   Well woman visit with normal exam. Status post TVH.  Ovaries remain.  Remote hx of CIN II and III. No recurrence of prolapse.  RLQ discomfort.  Normal pelvic ultrasound last year.   Plan: Yearly mammogram recommended after age 44.  Recommended self breast exam.  Pap and HR HPV as above. Discussed Calcium, Vitamin D, regular exercise program including cardiovascular and weight bearing exercise. Labs performed.  No..     Refills given on medications.  No..  I suggested a daily stool softener and increased water to treat potential constipation symptoms.  If RLQ discomfort persists, return to PCP for further evaluation.     Follow up annually and prn.      After visit summary provided.

## 2015-07-04 ENCOUNTER — Encounter: Payer: Self-pay | Admitting: Internal Medicine

## 2016-03-09 ENCOUNTER — Ambulatory Visit (INDEPENDENT_AMBULATORY_CARE_PROVIDER_SITE_OTHER): Payer: BLUE CROSS/BLUE SHIELD | Admitting: Obstetrics and Gynecology

## 2016-03-09 ENCOUNTER — Other Ambulatory Visit: Payer: Self-pay | Admitting: *Deleted

## 2016-03-09 ENCOUNTER — Encounter: Payer: Self-pay | Admitting: Obstetrics and Gynecology

## 2016-03-09 ENCOUNTER — Other Ambulatory Visit: Payer: Self-pay | Admitting: Obstetrics and Gynecology

## 2016-03-09 ENCOUNTER — Telehealth: Payer: Self-pay | Admitting: *Deleted

## 2016-03-09 VITALS — BP 134/78 | HR 70 | Ht 65.75 in | Wt 202.0 lb

## 2016-03-09 DIAGNOSIS — G8929 Other chronic pain: Secondary | ICD-10-CM

## 2016-03-09 DIAGNOSIS — R1031 Right lower quadrant pain: Secondary | ICD-10-CM

## 2016-03-09 NOTE — Progress Notes (Signed)
GYNECOLOGY  VISIT   HPI: 60 y.o.   Married  Caucasian  female   [id number] with No LMP recorded. Patient has had a hysterectomy.   here for evaluation of a "sensation" in right lower side. She saw her PCP last week and he thought he could possibly feel a hernia. Left side is normal. Denies constipation or diarrhea.   Urine IN:[id number]  Not painful.  Notices it with laying down and bending over.  Not sure if it radiates.   No urinary pain, hesitance or difficulty emptying.  Still has her ovaries.  Had a normal pelvic ultrasound 2 years ago for evaluation of the same left lower quadrant discomfort.   Does heavy yard work.  Colonoscopy is due next year.  Had stress test and doppler ultrasound to rule out cardiac disease or DVT.   PCP - Dr. Reynaldo Minium.  GYNECOLOGIC HISTORY: No LMP recorded. Patient has had a hysterectomy. Contraception: Hysterectomy Menopausal hormone therapy:  none Last mammogram:  04-30-15 Bil.Silicone implants stable/ 3D/Density B;benign intramammary node in both breasts/Neg/BiRads2:[facility] Last pap smear:   12-01-11 normal        OB History    Gravida Para Term Preterm AB Living   3 2 2   1 2    SAB TAB Ectopic Multiple Live Births                     There are no active problems to display for this patient.   Past Medical History:  Diagnosis Date  . Abnormal Pap smear of cervix 1986   --hx cryotherapy to cervix for CIN II and CIN III  . Arthritis   . Colon polyps    adenomatous  . Diverticulosis     Past Surgical History:  Procedure Laterality Date  . ABDOMINAL HYSTERECTOMY  1995   TVH--ovaries remain  . AUGMENTATION MAMMAPLASTY  99991111   silicone  . CYSTOSCOPY  03/08/2012   Procedure: CYSTOSCOPY;  Surgeon: Reece Packer, MD;  Location: [facility];  Service: Urology;  Laterality: N/A;  . RECTOCELE REPAIR  03/08/2012   Procedure: POSTERIOR REPAIR (RECTOCELE);  Surgeon: Reece Packer, MD;  Location: [facility];  Service: Urology;  Laterality: N/A;   . VAGINAL PROLAPSE REPAIR  03/08/2012   Procedure: VAGINAL VAULT SUSPENSION;  Surgeon: Reece Packer, MD;  Location: [facility];  Service: Urology;  Laterality: N/A;  with Graft    No current outpatient prescriptions on file.   No current facility-administered medications for this visit.      ALLERGIES: Codeine  Family History  Problem Relation Age of Onset  . Thyroid disease Mother     goiter  . Stroke Father   . Heart attack Father   . Hypertension Brother   . Heart disease Maternal Grandfather     Social History   Social History  . Marital status: Married    Spouse name: N/A  . Number of children: N/A  . Years of education: N/A   Occupational History  . Not on file.   Social History Main Topics  . Smoking status: Never Smoker  . Smokeless tobacco: Never Used  . Alcohol use 0.6 oz/week    1 Standard drinks or equivalent per week     Comment: OCCASIONAL-wine  . Drug use: No  . Sexual activity: No     Comment: TVH--still has ovaries, husband with prostate cancer   Other Topics Concern  . Not on file   Social History Narrative  . No narrative  on file    ROS:  Pertinent items are noted in HPI.  PHYSICAL EXAMINATION:    BP 134/78 (BP Location: Right Arm, Patient Position: Sitting, Cuff Size: Large)   Pulse 70   Ht 5' 5.75" (1.67 m)   Wt 202 lb (91.6 kg)   BMI 32.85 kg/m     General appearance: alert, cooperative and appears stated age   Abdomen: soft, non-tender, no masses,  no organomegaly   Pelvic: External genitalia:  no lesions              Urethra:  normal appearing urethra with no masses, tenderness or lesions              Bartholins and Skenes: normal                 Vagina: normal appearing vagina with normal color and discharge, no lesions              Cervix:  absent                Bimanual Exam:  Uterus:  Absent.              Adnexa: no mass, fullness, tenderness              Rectal exam: Yes.  .  Confirms.              Anus:  normal  sphincter tone, no lesions  Chaperone was present for exam.  ASSESSMENT  RLQ pain.  Uncertain etiology.  Hyx of TVH and vaginal prolapse repair.  PLAN  Discussion of potential sources of pain - ovaries, colon, urinary system, musculoskeletal, scar tissue. CT scan of the abdomen and pelvis with contrast.  Copy to go to PCP.  If the CT is negative, proceed with GI consult and potential colonoscopy.   An After Visit Summary was printed and given to the patient.  ___15___ minutes face to face time of which over 50% was spent in counseling.

## 2016-03-09 NOTE — Telephone Encounter (Signed)
Requested recent labs faxed to [phone].

## 2016-03-09 NOTE — Telephone Encounter (Signed)
Spoke with Rodena Piety in Medical records, with Dr. Reynaldo Minium. Request most recent labs drawn, per [facility], prior to CT scan. Fax to [phone].

## 2016-03-09 NOTE — Progress Notes (Unsigned)
Requested most recent labs, faxed to [facility] at [phone].

## 2016-03-09 NOTE — Progress Notes (Signed)
Patient in office. Patient scheduled for CT of abdomen and pelvis with contrast, results to be called to Dr.Silva and Dr. Reynaldo Minium. RLQ pain; possible hernia. History of hysterectomy; ovaries in place.Patient scheduled 03/11/16 at 5pm, arriving at 4:40pm. Patient advised to pick up contrast at [facility] prior to 4:30pm on 03/10/16. Advised patient no solid food 4 hours prior to CT, ok to drink fluids and take meds. Patient is agreeable to date and time.   [facility] request copy of recent labs to be faxed prior to CT.

## 2016-03-11 ENCOUNTER — Ambulatory Visit
Admission: RE | Admit: 2016-03-11 | Discharge: 2016-03-11 | Disposition: A | Discharge status: Home / Self Care | Payer: BLUE CROSS/BLUE SHIELD | Source: Ambulatory Visit | Attending: Obstetrics and Gynecology | Admitting: Obstetrics and Gynecology

## 2016-03-11 DIAGNOSIS — R1031 Right lower quadrant pain: Secondary | ICD-10-CM

## 2016-03-11 MED ORDER — IOPAMIDOL (ISOVUE-300) INJECTION 61%
125.0000 mL | Freq: Once | INTRAVENOUS | Status: AC | PRN
  Administered 2016-03-11: 125 mL via INTRAVENOUS

## 2016-03-12 ENCOUNTER — Telehealth: Payer: Self-pay | Admitting: Obstetrics and Gynecology

## 2016-03-12 NOTE — Telephone Encounter (Signed)
Phone call to review results of CT of abdomen with the patient.  Will have patient follow up with Dr. Reynaldo Minium and determine next steps in her care - MRI of abdomen, GI consultation.

## 2016-03-12 NOTE — Telephone Encounter (Signed)
Patient returned call to Dr. Quincy Simmonds.

## 2016-03-12 NOTE — Telephone Encounter (Signed)
Patient returning call.

## 2016-03-12 NOTE — Telephone Encounter (Signed)
Phone call to cell phone [phone] per DPI.   Husband answered phone.  He asked me to call her on her cell - [phone] but that she may not answer because she is in an important meeting.  I just left a message for her to return my call.  No other information given.   CT of abdomen and pelvis today returned showing: Study Result   CLINICAL DATA:  RLQ pain x 2 months. ? Pulling injury lifting heavy rocks. No other symptoms. Prior hyst, pelvic floor repair. No hx ca. No prev CT.  EXAM: CT ABDOMEN AND PELVIS WITH CONTRAST  TECHNIQUE: Multidetector CT imaging of the abdomen and pelvis was performed using the standard protocol following bolus administration of intravenous contrast.  CONTRAST:  175mL ISOVUE-300 IOPAMIDOL (ISOVUE-300) INJECTION 61%  COMPARISON:  None  FINDINGS: Lower chest: Lung bases are clear.  Hepatobiliary: Enhancing 10 mm lesion in the RIGHT hepatic lobe (image 20, series 2). No biliary dilatation. Normal gallbladder  Pancreas: Pancreas is normal. No ductal dilatation. No pancreatic inflammation.  Spleen: Normal spleen  Adrenals/urinary tract: Adrenal glands and kidneys are normal. The ureters and bladder normal.  Stomach/Bowel: Stomach, small bowel, appendix, and cecum are normal. The colon and rectosigmoid colon are normal.  Vascular/Lymphatic: Stomach, small bowel, appendix, and cecum are normal. There are diverticula of the descending colon sigmoid colon. The number of diverticulosis high is no clear evidence of acute inflammation to suggest diverticulitis. The rectum is normal.  Reproductive: Post hysterectomy.  No adnexal abnormality  Other: No inguinal hernia or ventral hernia.  Musculoskeletal: No aggressive osseous lesion.  IMPRESSION: 1. Normal appendix. 2. No clear explanation for RIGHT lower quadrant pain. 3. No abdominal hernia. 4. Extensive LEFT colon sigmoid diverticulosis without evidence  of diverticulitis. 5. Small enhancing lesion in the RIGHT hepatic lobe is favored to represent a flash filling hemangioma although cannot be fully characterize. In the absence of known malignancy this likely represents a benign lesion. Further characterization could be achieved with contrast MRI of the abdomen if clinically warranted.   Electronically Signed   By: Suzy Bouchard M.D.   On: 03/12/2016 09:04    I will have patient follow up with PCP.

## 2016-03-13 ENCOUNTER — Encounter: Payer: Self-pay | Admitting: Obstetrics & Gynecology

## 2016-06-03 ENCOUNTER — Encounter: Payer: Self-pay | Admitting: Obstetrics and Gynecology

## 2016-06-03 ENCOUNTER — Ambulatory Visit (INDEPENDENT_AMBULATORY_CARE_PROVIDER_SITE_OTHER): Payer: BLUE CROSS/BLUE SHIELD | Admitting: Obstetrics and Gynecology

## 2016-06-03 VITALS — BP 132/84 | HR 80 | Resp 14 | Ht 65.5 in | Wt 204.0 lb

## 2016-06-03 DIAGNOSIS — Z01419 Encounter for gynecological examination (general) (routine) without abnormal findings: Secondary | ICD-10-CM | POA: Diagnosis not present

## 2016-06-03 DIAGNOSIS — Z0000 Encounter for general adult medical examination without abnormal findings: Secondary | ICD-10-CM | POA: Diagnosis not present

## 2016-06-03 DIAGNOSIS — N393 Stress incontinence (female) (male): Secondary | ICD-10-CM | POA: Diagnosis not present

## 2016-06-03 LAB — POCT URINALYSIS DIPSTICK
Bilirubin, UA: NEGATIVE
GLUCOSE UA: NEGATIVE
Ketones, UA: NEGATIVE
Leukocytes, UA: NEGATIVE
Nitrite, UA: NEGATIVE
Protein, UA: NEGATIVE
RBC UA: NEGATIVE
UROBILINOGEN UA: NEGATIVE
pH, UA: 5

## 2016-06-03 NOTE — Progress Notes (Signed)
60 y.o. G43P2012 Married Caucasian female here for annual exam.    Had a CT scan 03/11/16 and patient is asking for follow up of this regarding the liver area. Due for colonoscopy next year.  Has hemorrhoids. Some rectal bleeding with a lot of wiping with toilet tissue. This is not spontaneous bleeding.  This is off and on for one month. States the toilet paper at work is terrible for skin.  Study Result   CLINICAL DATA:  RLQ pain x 2 months. ? Pulling injury lifting heavy rocks. No other symptoms. Prior hyst, pelvic floor repair. No hx ca. No prev CT.  EXAM: CT ABDOMEN AND PELVIS WITH CONTRAST  TECHNIQUE: Multidetector CT imaging of the abdomen and pelvis was performed using the standard protocol following bolus administration of intravenous contrast.  CONTRAST:  124mL ISOVUE-300 IOPAMIDOL (ISOVUE-300) INJECTION 61%  COMPARISON:  None  FINDINGS: Lower chest: Lung bases are clear.  Hepatobiliary: Enhancing 10 mm lesion in the RIGHT hepatic lobe (image 20, series 2). No biliary dilatation. Normal gallbladder  Pancreas: Pancreas is normal. No ductal dilatation. No pancreatic inflammation.  Spleen: Normal spleen  Adrenals/urinary tract: Adrenal glands and kidneys are normal. The ureters and bladder normal.  Stomach/Bowel: Stomach, small bowel, appendix, and cecum are normal. The colon and rectosigmoid colon are normal.  Vascular/Lymphatic: Stomach, small bowel, appendix, and cecum are normal. There are diverticula of the descending colon sigmoid colon. The number of diverticulosis high is no clear evidence of acute inflammation to suggest diverticulitis. The rectum is normal.  Reproductive: Post hysterectomy.  No adnexal abnormality  Other: No inguinal hernia or ventral hernia.  Musculoskeletal: No aggressive osseous lesion.  IMPRESSION: 1. Normal appendix. 2. No clear explanation for RIGHT lower quadrant pain. 3. No abdominal hernia. 4. Extensive  LEFT colon sigmoid diverticulosis without evidence of diverticulitis. 5. Small enhancing lesion in the RIGHT hepatic lobe is favored to represent a flash filling hemangioma although cannot be fully characterize. In the absence of known malignancy this likely represents a benign lesion. Further characterization could be achieved with contrast MRI of the abdomen if clinically warranted.   Electronically Signed   By: Suzy Bouchard M.D.   On: 03/12/2016 09:04   Can have GSI with some position changes.  Overall is much better than prior to surgery.  No pain with urination.  3 caffeine beverages per day.   No vaginal discharge.  PCP:  Burnard Bunting, MD   No LMP recorded. Patient has had a hysterectomy.           Sexually active: No. female The current method of family planning is status post hysterectomy.    Exercising: Yes.    walking Smoker:  no  Health Maintenance: Pap:  12-01-11 Negative History of abnormal Pap:  Yes, 1985 hx of cryotherapy to cervix for CIN II and CIN III MMG: 06-02-16 normal per patient:[facility] Colonoscopy:  03-03-12 polyp with Dr.John Perry;next due 02/2017. BMD:   n/a  Result  n/a TDaP:  PCP Gardasil:   N/A HIV: Hep C: Screening Labs:  Hb today: PCP, Urine today:  Negative.   reports that she has never smoked. She has never used smokeless tobacco. She reports that she drinks about 0.6 oz of alcohol per week . She reports that she does not use drugs.  Past Medical History:  Diagnosis Date  . Abnormal Pap smear of cervix 1986   --hx cryotherapy to cervix for CIN II and CIN III  . Arthritis   . Colon polyps  adenomatous  . Diverticulosis     Past Surgical History:  Procedure Laterality Date  . ABDOMINAL HYSTERECTOMY  1995   TVH--ovaries remain  . AUGMENTATION MAMMAPLASTY  99991111   silicone  . CYSTOSCOPY  03/08/2012   Procedure: CYSTOSCOPY;  Surgeon: Reece Packer, MD;  Location: [facility];  Service: Urology;  Laterality: N/A;  . RECTOCELE  REPAIR  03/08/2012   Procedure: POSTERIOR REPAIR (RECTOCELE);  Surgeon: Reece Packer, MD;  Location: [facility];  Service: Urology;  Laterality: N/A;  . VAGINAL PROLAPSE REPAIR  03/08/2012   Procedure: VAGINAL VAULT SUSPENSION;  Surgeon: Reece Packer, MD;  Location: [facility];  Service: Urology;  Laterality: N/A;  with Graft    No current outpatient prescriptions on file.   No current facility-administered medications for this visit.     Family History  Problem Relation Age of Onset  . Thyroid disease Mother     goiter  . Stroke Father   . Heart attack Father   . Hypertension Brother   . Heart disease Maternal Grandfather     ROS:  Pertinent items are noted in HPI.  Otherwise, a comprehensive ROS was negative.  Exam:   BP 132/84 (BP Location: Right Arm, Patient Position: Sitting, Cuff Size: Large)   Pulse 80   Resp 14   Ht 5' 5.5" (1.664 m)   Wt 204 lb (92.5 kg)   BMI 33.43 kg/m     General appearance: alert, cooperative and appears stated age Head: Normocephalic, without obvious abnormality, atraumatic Neck: no adenopathy, supple, symmetrical, trachea midline and thyroid normal to inspection and palpation Lungs: clear to auscultation bilaterally Breasts: normal appearance, no masses or tenderness, No nipple retraction or dimpling, No nipple discharge or bleeding, No axillary or supraclavicular adenopathy Heart: regular rate and rhythm Abdomen: soft, non-tender; no masses, no organomegaly Extremities: extremities normal, atraumatic, no cyanosis or edema Skin: Skin color, texture, turgor normal. No rashes or lesions Lymph nodes: Cervical, supraclavicular, and axillary nodes normal. No abnormal inguinal nodes palpated Neurologic: Grossly normal  Pelvic: External genitalia:  no lesions              Urethra:  normal appearing urethra with no masses, tenderness or lesions              Bartholins and Skenes: normal                 Vagina: normal appearing vagina with  normal color and discharge, no lesions.  Good support.              Cervix: absent.               Pap taken: No. Bimanual Exam:  Uterus:  Absent.               Adnexa: no mass, fullness, tenderness              Rectal exam: Yes.  .  Confirms.              Anus:  normal sphincter tone, no lesions  Chaperone was present for exam.  Assessment:   Well woman visit with normal exam. Status post TVH.  Ovaries remain.  Status post rectocele repair with vaginal vault suspension. Urinary incontinence. Rectal bleeding.  May be due to toilet tissue at work. Probable liver hemangioma.    Plan: Yearly mammogram recommended after age 52.  Recommended self breast exam.  Pap and HR HPV as above. Discussed Calcium, Vitamin D, regular exercise  program including cardiovascular and weight bearing exercise. BMD next year.  Discussed bladder irritants. Follow up with gastroenterology.  Patient will call herself to schedule this. She is due for a colonoscopy.  We will send a copy of the CT scan to her gastroenterologist who can make any necessary recommendations regarding the suspected hemangioma.   Follow up annually and prn.       After visit summary provided.

## 2016-06-03 NOTE — Patient Instructions (Signed)

## 2016-06-17 ENCOUNTER — Encounter: Payer: Self-pay | Admitting: Obstetrics and Gynecology

## 2016-11-02 ENCOUNTER — Encounter: Payer: Self-pay | Admitting: Internal Medicine

## 2016-11-02 ENCOUNTER — Ambulatory Visit (INDEPENDENT_AMBULATORY_CARE_PROVIDER_SITE_OTHER): Payer: 59 | Admitting: Internal Medicine

## 2016-11-02 VITALS — BP 120/80 | HR 80 | Ht 65.0 in | Wt 202.0 lb

## 2016-11-02 DIAGNOSIS — K625 Hemorrhage of anus and rectum: Secondary | ICD-10-CM | POA: Diagnosis not present

## 2016-11-02 DIAGNOSIS — Z86010 Personal history of colonic polyps: Secondary | ICD-10-CM | POA: Diagnosis not present

## 2016-11-02 MED ORDER — NA SULFATE-K SULFATE-MG SULF 17.5-3.13-1.6 GM/177ML PO SOLN: 1.0000 | Freq: Once | ORAL | 0 refills | Status: AC

## 2016-11-02 NOTE — Progress Notes (Signed)
HISTORY OF PRESENT ILLNESS:  Karen Todd is a 61 y.o. female with a history of adenomatous colon polyps who presents today with a chief complaint of intermittent minor rectal bleeding. The patient underwent index colonoscopy 2008 and was found to have tubular adenoma. Follow-up examination 2013 revealed a small non-adenomatous colon polyp and moderate diverticulosis. Follow-up this year recommended. She reports to me a several month history of intermittent rectal bleeding as manifested by red blood on the tissue when wiping only. Typically this occurs work with what she describes as "rough toilet paper". There is some associated anal discomfort. She does not notice blood in the toilet bowl or admixed with stool. No abdominal pain or change in bowel habits.  REVIEW OF SYSTEMS:  All non-GI ROS negative upon complete and extensive review of all systems non-GI  Past Medical History:  Diagnosis Date  . Abnormal Pap smear of cervix 1986   --hx cryotherapy to cervix for CIN II and CIN III  . Arthritis   . Colon polyps    adenomatous  . Diverticulosis     Past Surgical History:  Procedure Laterality Date  . ABDOMINAL HYSTERECTOMY  1995   TVH--ovaries remain  . AUGMENTATION MAMMAPLASTY  7619   silicone  . CYSTOSCOPY  03/08/2012   Procedure: CYSTOSCOPY;  Surgeon: Reece Packer, MD;  Location: [facility];  Service: Urology;  Laterality: N/A;  . RECTOCELE REPAIR  03/08/2012   Procedure: POSTERIOR REPAIR (RECTOCELE);  Surgeon: Reece Packer, MD;  Location: [facility];  Service: Urology;  Laterality: N/A;  . VAGINAL PROLAPSE REPAIR  03/08/2012   Procedure: VAGINAL VAULT SUSPENSION;  Surgeon: Reece Packer, MD;  Location: [facility];  Service: Urology;  Laterality: N/A;  with Graft    Social History Karen Todd  reports that she has never smoked. She has never used smokeless tobacco. She reports that she drinks about 0.6 oz of alcohol per week . She reports that she does not use drugs.  family  history includes Heart attack in her father; Heart disease in her maternal grandfather; Hypertension in her brother; Stroke in her father; Thyroid disease in her mother.  Allergies  Allergen Reactions  . Codeine Nausea And Vomiting       PHYSICAL EXAMINATION: Vital signs: BP 120/80   Pulse 80   Ht 5\' 5"  (1.651 m)   Wt 202 lb (91.6 kg)   BMI 33.61 kg/m   Constitutional: generally well-appearing, no acute distress Psychiatric: alert and oriented x3, cooperative Eyes: extraocular movements intact, anicteric, conjunctiva pink Mouth: oral pharynx moist, no lesions Neck: supple no lymphadenopathy Cardiovascular: heart regular rate and rhythm, no murmur Lungs: clear to auscultation bilaterally Abdomen: soft, nontender, nondistended, no obvious ascites, no peritoneal signs, normal bowel sounds, no organomegaly Rectal: Deferred until colonoscopy Extremities: no clubbing cyanosis or lower extremity edema bilaterally Skin: no lesions on visible extremities Neuro: No focal deficits. Cranial nerves intact  ASSESSMENT:  #1. Minor intermittent rectal bleeding as described. Anal fissure versus hemorrhoids. Suspect will only need medical therapy based on description  #2. History of adenomatous colon polyps. Due for surveillance #3. Moderate diverticulosis    PLAN:  #1. Colonoscopy.The nature of the procedure, as well as the risks, benefits, and alternatives were carefully and thoroughly reviewed with the patient. Ample time for discussion and questions allowed. The patient understood, was satisfied, and agreed to proceed. #2. Final recommendations regarding etiology of rectal bleeding to be given post colonoscopy

## 2016-11-02 NOTE — Patient Instructions (Signed)

## 2016-11-10 ENCOUNTER — Encounter: Payer: Self-pay | Admitting: Internal Medicine

## 2016-11-19 ENCOUNTER — Encounter: Payer: Self-pay | Admitting: Internal Medicine

## 2016-11-19 ENCOUNTER — Telehealth: Payer: Self-pay

## 2016-11-19 ENCOUNTER — Ambulatory Visit (AMBULATORY_SURGERY_CENTER): Payer: 59 | Admitting: Internal Medicine

## 2016-11-19 VITALS — BP 110/55 | HR 73 | Temp 99.3°F | Resp 10 | Ht 65.0 in | Wt 202.0 lb

## 2016-11-19 DIAGNOSIS — Z86010 Personal history of colonic polyps: Secondary | ICD-10-CM

## 2016-11-19 DIAGNOSIS — K625 Hemorrhage of anus and rectum: Secondary | ICD-10-CM

## 2016-11-19 DIAGNOSIS — D124 Benign neoplasm of descending colon: Secondary | ICD-10-CM

## 2016-11-19 DIAGNOSIS — K635 Polyp of colon: Secondary | ICD-10-CM | POA: Diagnosis not present

## 2016-11-19 MED ORDER — SODIUM CHLORIDE 0.9 % IV SOLN: 500.0000 mL | INTRAVENOUS | Status: DC

## 2016-11-19 NOTE — Progress Notes (Signed)
Called to room to assist during endoscopic procedure.  Patient ID and intended procedure confirmed with present staff. Received instructions for my participation in the procedure from the performing physician.  

## 2016-11-19 NOTE — Patient Instructions (Signed)
Impression/Recommendations:  Polyp handout given to patient. Diverticulosis handout given to patient.  Repeat colonoscopy in 5 years for surveillance.  Resume previous diet. Continue present medications. Await pathology results.  Metamucil 2 Tablespoons daily and sitz baths for anal fissure healing.  YOU HAD AN ENDOSCOPIC PROCEDURE TODAY AT [facility]:   Refer to the procedure report that was given to you for any specific questions about what was found during the examination.  If the procedure report does not answer your questions, please call your gastroenterologist to clarify.  If you requested that your care partner not be given the details of your procedure findings, then the procedure report has been included in a sealed envelope for you to review at your convenience later.  YOU SHOULD EXPECT: Some feelings of bloating in the abdomen. Passage of more gas than usual.  Walking can help get rid of the air that was put into your GI tract during the procedure and reduce the bloating. If you had a lower endoscopy (such as a colonoscopy or flexible sigmoidoscopy) you may notice spotting of blood in your stool or on the toilet paper. If you underwent a bowel prep for your procedure, you may not have a normal bowel movement for a few days.  Please Note:  You might notice some irritation and congestion in your nose or some drainage.  This is from the oxygen used during your procedure.  There is no need for concern and it should clear up in a day or so.  SYMPTOMS TO REPORT IMMEDIATELY:   Following lower endoscopy (colonoscopy or flexible sigmoidoscopy):  Excessive amounts of blood in the stool  Significant tenderness or worsening of abdominal pains  Swelling of the abdomen that is new, acute  Fever of 100F or higher For urgent or emergent issues, a gastroenterologist can be reached at any hour by calling [phone].   DIET:  We do recommend a small meal at first, but  then you may proceed to your regular diet.  Drink plenty of fluids but you should avoid alcoholic beverages for 24 hours.  ACTIVITY:  You should plan to take it easy for the rest of today and you should NOT DRIVE or use heavy machinery until tomorrow (because of the sedation medicines used during the test).    FOLLOW UP: Our staff will call the number listed on your records the next business day following your procedure to check on you and address any questions or concerns that you may have regarding the information given to you following your procedure. If we do not reach you, we will leave a message.  However, if you are feeling well and you are not experiencing any problems, there is no need to return our call.  We will assume that you have returned to your regular daily activities without incident.  If any biopsies were taken you will be contacted by phone or by letter within the next 1-3 weeks.  Please call us at [phone] if you have not heard about the biopsies in 3 weeks.    SIGNATURES/CONFIDENTIALITY: You and/or your care partner have signed paperwork which will be entered into your electronic medical record.  These signatures attest to the fact that that the information above on your After Visit Summary has been reviewed and is understood.  Full responsibility of the confidentiality of this discharge information lies with you and/or your care-partner.

## 2016-11-19 NOTE — Progress Notes (Signed)
Report to PACU, RN, vss, BBS= Clear.  

## 2016-11-19 NOTE — Op Note (Signed)
[facility] Patient Name: Karen Todd Procedure Date: 11/19/2016 10:52 AM MRN: [id number] Endoscopist: Docia Chuck. Henrene Pastor , MD Age: 61 Referring MD:  Date of Birth: July 23, 1955 Gender: Female Account #: [id number] Procedure:                Colonoscopy, with cold snare polypectomy x 1 Indications:              High risk colon cancer surveillance: Personal                            history of non-advanced adenoma. Previous                            examinations 2008 and 2013. Incidental minor rectal                            bleeding with rectal burning discomfort Medicines:                Monitored Anesthesia Care Procedure:                Pre-Anesthesia Assessment:                           - Prior to the procedure, a History and Physical                            was performed, and patient medications and                            allergies were reviewed. The patient's tolerance of                            previous anesthesia was also reviewed. The risks                            and benefits of the procedure and the sedation                            options and risks were discussed with the patient.                            All questions were answered, and informed consent                            was obtained. Prior Anticoagulants: The patient has                            taken no previous anticoagulant or antiplatelet                            agents. ASA Grade Assessment: II - A patient with                            mild systemic disease. After reviewing the risks  and benefits, the patient was deemed in                            satisfactory condition to undergo the procedure.                           After obtaining informed consent, the colonoscope                            was passed under direct vision. Throughout the                            procedure, the patient's blood pressure, pulse, and   oxygen saturations were monitored continuously. The                            Colonoscope was introduced through the anus and                            advanced to the the cecum, identified by                            appendiceal orifice and ileocecal valve. The                            ileocecal valve, appendiceal orifice, and rectum                            were photographed. The quality of the bowel                            preparation was excellent. The colonoscopy was                            performed without difficulty. The patient tolerated                            the procedure well. The bowel preparation used was                            SUPREP. Scope In: 11:03:31 AM Scope Out: 11:16:40 AM Scope Withdrawal Time: 0 hours 9 minutes 18 seconds  Total Procedure Duration: 0 hours 13 minutes 9 seconds  Findings:                 A 3 mm polyp was found in the descending colon. The                            polyp was sessile. The polyp was removed with a                            cold snare. Resection and retrieval were complete.                           Multiple diverticula were  found in the entire colon.                           The exam was otherwise without abnormality on                            direct and retroflexion views.                           * Rectal Exam: Small posterior fissure Complications:            No immediate complications. Estimated blood loss:                            None. Estimated Blood Loss:     Estimated blood loss: none. Impression:               - One 3 mm polyp in the descending colon, removed                            with a cold snare. Resected and retrieved.                           - Diverticulosis in the entire examined colon.                           - The examination was otherwise normal on direct                            and retroflexion views.                           - Small posterior anal fissure Recommendation:            - Repeat colonoscopy in 5 years for surveillance.                           - Patient has a contact number available for                            emergencies. The signs and symptoms of potential                            delayed complications were discussed with the                            patient. Return to normal activities tomorrow.                            Written discharge instructions were provided to the                            patient.                           - Resume previous diet.                           -  Continue present medications.                           - Await pathology results.                           - Recommend Metamucil 2 tablespoons daily and sitz                            baths daily for anal fissure healing John N. Henrene Pastor, MD 11/19/2016 11:23:58 AM This report has been signed electronically.

## 2016-11-20 NOTE — Telephone Encounter (Signed)
  Follow up Call-  Call back number 11/19/2016  Post procedure Call Back phone  # [phone]  Permission to leave phone message Yes  Some recent data might be hidden     Patient questions:  Do you have a fever, pain , or abdominal swelling? No. Pain Score  0 *  Have you tolerated food without any problems? Yes.    Have you been able to return to your normal activities? Yes.    Do you have any questions about your discharge instructions: Diet   No. Medications  No. Follow up visit  No.  Do you have questions or concerns about your Care? No.  Actions: * If pain score is 4 or above: No action needed, pain <4.

## 2016-11-24 ENCOUNTER — Encounter: Payer: Self-pay | Admitting: Internal Medicine

## 2017-05-10 ENCOUNTER — Other Ambulatory Visit (HOSPITAL_COMMUNITY): Payer: Self-pay | Admitting: Internal Medicine

## 2017-05-10 DIAGNOSIS — R1031 Right lower quadrant pain: Secondary | ICD-10-CM

## 2017-05-18 ENCOUNTER — Ambulatory Visit (HOSPITAL_COMMUNITY)
Admission: RE | Admit: 2017-05-18 | Discharge: 2017-05-18 | Disposition: A | Discharge status: Home / Self Care | Payer: 59 | Source: Ambulatory Visit | Attending: Internal Medicine | Admitting: Internal Medicine

## 2017-05-18 DIAGNOSIS — R1031 Right lower quadrant pain: Secondary | ICD-10-CM | POA: Diagnosis present

## 2017-05-18 DIAGNOSIS — K5730 Diverticulosis of large intestine without perforation or abscess without bleeding: Secondary | ICD-10-CM | POA: Diagnosis not present

## 2017-05-18 LAB — POCT I-STAT CREATININE: Creatinine, Ser: 1 mg/dL (ref 0.44–1.00)

## 2017-05-18 MED ORDER — IOPAMIDOL (ISOVUE-300) INJECTION 61%
INTRAVENOUS | Status: AC
  Administered 2017-05-18: 100 mL
  Filled 2017-05-18: qty 100

## 2017-06-04 ENCOUNTER — Ambulatory Visit: Payer: BLUE CROSS/BLUE SHIELD | Admitting: Obstetrics and Gynecology

## 2017-06-21 ENCOUNTER — Telehealth: Payer: Self-pay

## 2017-06-21 NOTE — Telephone Encounter (Signed)
Spoke with patient and notified of normal BMD results from [facility] dated 06-03-17. She has AEX with Dr.Silva on 07-12-17. Will scan report into Epic.

## 2017-07-07 NOTE — Progress Notes (Deleted)
62 y.o. G66P2012 Married Caucasian female here for annual exam.    PCP:     No LMP recorded. Patient has had a hysterectomy.           Sexually active: {yes no:314532}  The current method of family planning is status post hysterectomy.    Exercising: {yes no:314532}  {types:19826} Smoker:  no  Health Maintenance: Pap: 12-01-11 Neg History of abnormal Pap:  Yes, 1985 hx of cryotherapy to cervix for CIN II and CIN III. MMG: *** Colonoscopy: 11-19-16 polyp Dr.Perry;next due 10/2021 BMD: 06-03-17 Result : Normal/[facility] TDaP:  *** Gardasil:   no HIV: *** Hep C: *** Screening Labs:  Hb today: ***, Urine today: ***   reports that  has never smoked. she has never used smokeless tobacco. She reports that she drinks about 0.6 oz of alcohol per week. She reports that she does not use drugs.  Past Medical History:  Diagnosis Date  . Abnormal Pap smear of cervix 1986   --hx cryotherapy to cervix for CIN II and CIN III  . Allergy   . Arthritis   . Colon polyps    adenomatous  . Diverticulosis     Past Surgical History:  Procedure Laterality Date  . ABDOMINAL HYSTERECTOMY  1995   TVH--ovaries remain  . AUGMENTATION MAMMAPLASTY  7169   silicone  . COLONOSCOPY    . CYSTOSCOPY  03/08/2012   Procedure: CYSTOSCOPY;  Surgeon: Reece Packer, MD;  Location: [facility];  Service: Urology;  Laterality: N/A;  . RECTOCELE REPAIR  03/08/2012   Procedure: POSTERIOR REPAIR (RECTOCELE);  Surgeon: Reece Packer, MD;  Location: [facility];  Service: Urology;  Laterality: N/A;  . VAGINAL PROLAPSE REPAIR  03/08/2012   Procedure: VAGINAL VAULT SUSPENSION;  Surgeon: Reece Packer, MD;  Location: [facility];  Service: Urology;  Laterality: N/A;  with Graft    No current outpatient medications on file.   Current Facility-Administered Medications  Medication Dose Route Frequency Provider Last Rate Last Dose  . 0.9 %  sodium chloride infusion  500 mL Intravenous Continuous Irene Shipper, MD         Family History  Problem Relation Age of Onset  . Thyroid disease Mother        goiter  . Stroke Father   . Heart attack Father   . Hypertension Brother   . Heart disease Maternal Grandfather   . Colon cancer Neg Hx   . Stomach cancer Neg Hx   . Esophageal cancer Neg Hx   . Rectal cancer Neg Hx     ROS:  Pertinent items are noted in HPI.  Otherwise, a comprehensive ROS was negative.  Exam:   There were no vitals taken for this visit.    General appearance: alert, cooperative and appears stated age Head: Normocephalic, without obvious abnormality, atraumatic Neck: no adenopathy, supple, symmetrical, trachea midline and thyroid normal to inspection and palpation Lungs: clear to auscultation bilaterally Breasts: normal appearance, no masses or tenderness, No nipple retraction or dimpling, No nipple discharge or bleeding, No axillary or supraclavicular adenopathy Heart: regular rate and rhythm Abdomen: soft, non-tender; no masses, no organomegaly Extremities: extremities normal, atraumatic, no cyanosis or edema Skin: Skin color, texture, turgor normal. No rashes or lesions Lymph nodes: Cervical, supraclavicular, and axillary nodes normal. No abnormal inguinal nodes palpated Neurologic: Grossly normal  Pelvic: External genitalia:  no lesions              Urethra:  normal appearing urethra  with no masses, tenderness or lesions              Bartholins and Skenes: normal                 Vagina: normal appearing vagina with normal color and discharge, no lesions              Cervix: no lesions              Pap taken: {yes no:314532} Bimanual Exam:  Uterus:  normal size, contour, position, consistency, mobility, non-tender              Adnexa: no mass, fullness, tenderness              Rectal exam: {yes no:314532}.  Confirms.              Anus:  normal sphincter tone, no lesions  Chaperone was present for exam.  Assessment:   Well woman visit with normal  exam.   Plan: Mammogram screening discussed. Recommended self breast awareness. Pap and HR HPV as above. Guidelines for Calcium, Vitamin D, regular exercise program including cardiovascular and weight bearing exercise.   Follow up annually and prn.   Additional counseling given.  {yes [id number]. _______ minutes face to face time of which over 50% was spent in counseling.    After visit summary provided.

## 2017-07-12 ENCOUNTER — Ambulatory Visit: Payer: Self-pay | Admitting: Obstetrics and Gynecology

## 2017-07-14 ENCOUNTER — Other Ambulatory Visit: Payer: Self-pay

## 2017-07-14 ENCOUNTER — Other Ambulatory Visit (HOSPITAL_COMMUNITY)
Admission: RE | Admit: 2017-07-14 | Discharge: 2017-07-14 | Disposition: A | Discharge status: Home / Self Care | Payer: 59 | Source: Ambulatory Visit | Attending: Obstetrics & Gynecology | Admitting: Obstetrics & Gynecology

## 2017-07-14 ENCOUNTER — Ambulatory Visit: Payer: BLUE CROSS/BLUE SHIELD | Admitting: Obstetrics & Gynecology

## 2017-07-14 ENCOUNTER — Encounter: Payer: Self-pay | Admitting: Obstetrics & Gynecology

## 2017-07-14 VITALS — BP 118/68 | HR 80 | Resp 16 | Ht 65.25 in | Wt 197.0 lb

## 2017-07-14 DIAGNOSIS — D1803 Hemangioma of intra-abdominal structures: Secondary | ICD-10-CM

## 2017-07-14 DIAGNOSIS — Z01419 Encounter for gynecological examination (general) (routine) without abnormal findings: Secondary | ICD-10-CM

## 2017-07-14 DIAGNOSIS — Z124 Encounter for screening for malignant neoplasm of cervix: Secondary | ICD-10-CM

## 2017-07-14 NOTE — Progress Notes (Signed)
62 y.o. [id number] MarriedCaucasianF here for annual exam.  Doing well.  No vaginal bleeding.    PCP:  Dr. Reynaldo Minium.  Was seen in September.   No LMP recorded. Patient has had a hysterectomy.          Sexually active: No.  The current method of family planning is status post hysterectomy.    Exercising: Yes.    walking Smoker:  no  Health Maintenance: Pap:  12-01-11 Negative History of abnormal Pap:  Yes, 1985 hx of cryotherapy to cervix for CIN II and CIN III MMG:  December 2018 per patient normal - [facility] Colonoscopy:  11/19/16 polyp removed; repeat 5 years BMD:  December 2018 per patient normal - [facility] TDaP:  UTD with PCP Pneumonia vaccine(s):  none Shingrix:   none Hep C testing: unsure Screening Labs: PCP   reports that  has never smoked. she has never used smokeless tobacco. She reports that she drinks about 0.6 oz of alcohol per week. She reports that she does not use drugs.  Past Medical History:  Diagnosis Date  . Abnormal Pap smear of cervix 1986   --hx cryotherapy to cervix for CIN II and CIN III  . Allergy   . Arthritis   . Colon polyps    adenomatous  . Diverticulosis     Past Surgical History:  Procedure Laterality Date  . ABDOMINAL HYSTERECTOMY  1995   TVH--ovaries remain  . AUGMENTATION MAMMAPLASTY  4235   silicone  . COLONOSCOPY    . CYSTOSCOPY  03/08/2012   Procedure: CYSTOSCOPY;  Surgeon: Reece Packer, MD;  Location: [facility];  Service: Urology;  Laterality: N/A;  . RECTOCELE REPAIR  03/08/2012   Procedure: POSTERIOR REPAIR (RECTOCELE);  Surgeon: Reece Packer, MD;  Location: [facility];  Service: Urology;  Laterality: N/A;  . VAGINAL PROLAPSE REPAIR  03/08/2012   Procedure: VAGINAL VAULT SUSPENSION;  Surgeon: Reece Packer, MD;  Location: [facility];  Service: Urology;  Laterality: N/A;  with Graft    No current outpatient medications on file.   Current Facility-Administered Medications  Medication Dose Route Frequency Provider Last Rate Last Dose   . 0.9 %  sodium chloride infusion  500 mL Intravenous Continuous Irene Shipper, MD        Family History  Problem Relation Age of Onset  . Thyroid disease Mother        goiter  . Stroke Father   . Heart attack Father   . Hypertension Brother   . Heart disease Maternal Grandfather   . Colon cancer Neg Hx   . Stomach cancer Neg Hx   . Esophageal cancer Neg Hx   . Rectal cancer Neg Hx     ROS:  Pertinent items are noted in HPI.  Otherwise, a comprehensive ROS was negative.  Exam:   BP 118/68 (BP Location: Right Arm, Patient Position: Sitting, Cuff Size: Large)   Pulse 80   Resp 16   Ht 5' 5.25" (1.657 m)   Wt 197 lb (89.4 kg)   BMI 32.53 kg/m     Height: 5' 5.25" (165.7 cm)  Ht Readings from Last 3 Encounters:  07/14/17 5' 5.25" (1.657 m)  11/19/16 5\' 5"  (1.651 m)  11/02/16 5\' 5"  (1.651 m)    General appearance: alert, cooperative and appears stated age Head: Normocephalic, without obvious abnormality, atraumatic Neck: no adenopathy, supple, symmetrical, trachea midline and thyroid normal to inspection and palpation Lungs: clear to auscultation bilaterally Breasts: normal appearance, no masses  or tenderness Heart: regular rate and rhythm Abdomen: soft, non-tender; bowel sounds normal; no masses,  no organomegaly Extremities: extremities normal, atraumatic, no cyanosis or edema Skin: Skin color, texture, turgor normal. No rashes or lesions Lymph nodes: Cervical, supraclavicular, and axillary nodes normal. No abnormal inguinal nodes palpated Neurologic: Grossly normal   Pelvic: External genitalia:  no lesions              Urethra:  normal appearing urethra with no masses, tenderness or lesions              Bartholins and Skenes: normal                 Vagina: normal appearing vagina with normal color and discharge, no lesions              Cervix: absent              Pap taken: Yes.   Bimanual Exam:  Uterus:  uterus absent              Adnexa: no mass, fullness,  tenderness               Rectovaginal: Confirms               Anus:  normal sphincter tone, no lesions  Chaperone was present for exam.  A:  Well Woman with normal exam PMP, no HRT H/O TVH due to recurrent abnormal Pap smears H/O rectocele repair with vaginal vault suspension Urinary incontinence, mild H/o diverticulosis  P:   Mammogram guidelines reviewed.  Doing yearly. pap smear obtained.  Pt desired. Colonoscopy, lab work, BMD, Vaccines UTD.  Release signed for MMG and BMD from [facility]. return annually or prn

## 2017-07-15 LAB — CYTOLOGY - PAP: DIAGNOSIS: NEGATIVE

## 2017-07-16 DIAGNOSIS — D1803 Hemangioma of intra-abdominal structures: Secondary | ICD-10-CM | POA: Insufficient documentation

## 2017-08-09 ENCOUNTER — Encounter: Payer: Self-pay | Admitting: Obstetrics and Gynecology

## 2017-09-06 DIAGNOSIS — E663 Overweight: Secondary | ICD-10-CM | POA: Diagnosis not present

## 2017-09-06 DIAGNOSIS — E7849 Other hyperlipidemia: Secondary | ICD-10-CM | POA: Diagnosis not present

## 2017-09-06 DIAGNOSIS — Z1389 Encounter for screening for other disorder: Secondary | ICD-10-CM | POA: Diagnosis not present

## 2017-09-06 DIAGNOSIS — K589 Irritable bowel syndrome without diarrhea: Secondary | ICD-10-CM | POA: Diagnosis not present

## 2018-02-25 DIAGNOSIS — E7849 Other hyperlipidemia: Secondary | ICD-10-CM | POA: Diagnosis not present

## 2018-02-25 DIAGNOSIS — Z0000 Encounter for general adult medical examination without abnormal findings: Secondary | ICD-10-CM | POA: Diagnosis not present

## 2018-02-25 DIAGNOSIS — R82998 Other abnormal findings in urine: Secondary | ICD-10-CM | POA: Diagnosis not present

## 2018-03-04 DIAGNOSIS — L603 Nail dystrophy: Secondary | ICD-10-CM | POA: Diagnosis not present

## 2018-03-04 DIAGNOSIS — L570 Actinic keratosis: Secondary | ICD-10-CM | POA: Diagnosis not present

## 2018-03-04 DIAGNOSIS — D2261 Melanocytic nevi of right upper limb, including shoulder: Secondary | ICD-10-CM | POA: Diagnosis not present

## 2018-03-04 DIAGNOSIS — D2272 Melanocytic nevi of left lower limb, including hip: Secondary | ICD-10-CM | POA: Diagnosis not present

## 2018-03-04 DIAGNOSIS — D225 Melanocytic nevi of trunk: Secondary | ICD-10-CM | POA: Diagnosis not present

## 2018-03-07 DIAGNOSIS — Z0000 Encounter for general adult medical examination without abnormal findings: Secondary | ICD-10-CM | POA: Diagnosis not present

## 2018-03-07 DIAGNOSIS — E7849 Other hyperlipidemia: Secondary | ICD-10-CM | POA: Diagnosis not present

## 2018-03-07 DIAGNOSIS — Z1389 Encounter for screening for other disorder: Secondary | ICD-10-CM | POA: Diagnosis not present

## 2018-03-07 DIAGNOSIS — K589 Irritable bowel syndrome without diarrhea: Secondary | ICD-10-CM | POA: Diagnosis not present

## 2018-03-09 DIAGNOSIS — Z1212 Encounter for screening for malignant neoplasm of rectum: Secondary | ICD-10-CM | POA: Diagnosis not present

## 2018-04-28 DIAGNOSIS — Z23 Encounter for immunization: Secondary | ICD-10-CM | POA: Diagnosis not present

## 2018-06-06 DIAGNOSIS — Z1231 Encounter for screening mammogram for malignant neoplasm of breast: Secondary | ICD-10-CM | POA: Diagnosis not present

## 2018-06-06 DIAGNOSIS — Z803 Family history of malignant neoplasm of breast: Secondary | ICD-10-CM | POA: Diagnosis not present

## 2018-06-09 DIAGNOSIS — N6320 Unspecified lump in the left breast, unspecified quadrant: Secondary | ICD-10-CM | POA: Diagnosis not present

## 2018-07-15 NOTE — Progress Notes (Deleted)
63 y.o. G61P2012 Married Caucasian female here for annual exam.    PCP:     No LMP recorded. Patient has had a hysterectomy.           Sexually active: {yes no:314532}  The current method of family planning is status post hysterectomy.    Exercising: {yes no:314532}  {types:19826} Smoker:  no  Health Maintenance: Pap: 12-01-11 Neg History of abnormal Pap:  Yes, 1985 hx of cryotherapy to cervix for CIN II and CIN III MMG: 98-33-82 3D/Silicone implants/poss.0.5 cm mass Lt.Br., Rt.Br.neg/density B/further imaging rec.--Lt.Diag.is neg/BiRads/screening 1 yr. Colonoscopy: 11/19/16 polyp removed; repeat 5 years  BMD:   ***  Result  *** TDaP:  PCP Gardasil:   no HIV: *** Hep C: *** Screening Labs:  Hb today: ***, Urine today: ***   reports that she has never smoked. She has never used smokeless tobacco. She reports current alcohol use of about 1.0 standard drinks of alcohol per week. She reports that she does not use drugs.  Past Medical History:  Diagnosis Date  . Abnormal Pap smear of cervix 1986   --hx cryotherapy to cervix for CIN II and CIN III  . Allergy   . Arthritis   . Colon polyps    adenomatous  . Diverticulosis     Past Surgical History:  Procedure Laterality Date  . ABDOMINAL HYSTERECTOMY  1995   TVH--ovaries remain  . AUGMENTATION MAMMAPLASTY  5053   silicone  . COLONOSCOPY    . CYSTOSCOPY  03/08/2012   Procedure: CYSTOSCOPY;  Surgeon: Reece Packer, MD;  [facility];  Service: Urology;  Laterality: N/A;  . RECTOCELE REPAIR  03/08/2012   Procedure: POSTERIOR REPAIR (RECTOCELE);  Surgeon: Reece Packer, MD;  [facility];  Service: Urology;  Laterality: N/A;  . VAGINAL PROLAPSE REPAIR  03/08/2012   Procedure: VAGINAL VAULT SUSPENSION;  Surgeon: Reece Packer, MD;  Location: [facility];  Service: Urology;  Laterality: N/A;  with Graft    No current outpatient medications on file.   No current facility-administered medications for this visit.      Family History  Problem Relation Age of Onset  . Thyroid disease Mother        goiter  . Stroke Father   . Heart attack Father   . Hypertension Brother   . Heart disease Maternal Grandfather   . Colon cancer Neg Hx   . Stomach cancer Neg Hx   . Esophageal cancer Neg Hx   . Rectal cancer Neg Hx     Review of Systems  Exam:   There were no vitals taken for this visit.    General appearance: alert, cooperative and appears stated age Head: Normocephalic, without obvious abnormality, atraumatic Neck: no adenopathy, supple, symmetrical, trachea midline and thyroid normal to inspection and palpation Lungs: clear to auscultation bilaterally Breasts: normal appearance, no masses or tenderness, No nipple retraction or dimpling, No nipple discharge or bleeding, No axillary or supraclavicular adenopathy Heart: regular rate and rhythm Abdomen: soft, non-tender; no masses, no organomegaly Extremities: extremities normal, atraumatic, no cyanosis or edema Skin: Skin color, texture, turgor normal. No rashes or lesions Lymph nodes: Cervical, supraclavicular, and axillary nodes normal. No abnormal inguinal nodes palpated Neurologic: Grossly normal  Pelvic: External genitalia:  no lesions              Urethra:  normal appearing urethra with no masses, tenderness or lesions  Bartholins and Skenes: normal                 Vagina: normal appearing vagina with normal color and discharge, no lesions              Cervix: no lesions              Pap taken: {yes no:314532} Bimanual Exam:  Uterus:  normal size, contour, position, consistency, mobility, non-tender              Adnexa: no mass, fullness, tenderness              Rectal exam: {yes no:314532}.  Confirms.              Anus:  normal sphincter tone, no lesions  Chaperone was present for exam.  Assessment:   Well woman visit with normal exam.   Plan: Mammogram screening. Recommended self breast awareness. Pap and HR HPV  as above. Guidelines for Calcium, Vitamin D, regular exercise program including cardiovascular and weight bearing exercise.   Follow up annually and prn.   Additional counseling given.  {yes [id number]. _______ minutes face to face time of which over 50% was spent in counseling.    After visit summary provided.

## 2018-07-18 ENCOUNTER — Encounter: Payer: Self-pay | Admitting: Obstetrics and Gynecology

## 2018-07-18 ENCOUNTER — Other Ambulatory Visit (HOSPITAL_COMMUNITY)
Admission: RE | Admit: 2018-07-18 | Discharge: 2018-07-18 | Disposition: A | Discharge status: Home / Self Care | Payer: BLUE CROSS/BLUE SHIELD | Source: Ambulatory Visit | Attending: Obstetrics and Gynecology | Admitting: Obstetrics and Gynecology

## 2018-07-18 ENCOUNTER — Ambulatory Visit (INDEPENDENT_AMBULATORY_CARE_PROVIDER_SITE_OTHER): Payer: BLUE CROSS/BLUE SHIELD | Admitting: Obstetrics and Gynecology

## 2018-07-18 ENCOUNTER — Ambulatory Visit: Payer: Self-pay | Admitting: Obstetrics and Gynecology

## 2018-07-18 ENCOUNTER — Other Ambulatory Visit: Payer: Self-pay

## 2018-07-18 VITALS — BP 122/62 | HR 64 | Resp 14 | Ht 65.5 in | Wt 158.6 lb

## 2018-07-18 DIAGNOSIS — N6320 Unspecified lump in the left breast, unspecified quadrant: Secondary | ICD-10-CM

## 2018-07-18 DIAGNOSIS — Z01419 Encounter for gynecological examination (general) (routine) without abnormal findings: Secondary | ICD-10-CM

## 2018-07-18 NOTE — Patient Instructions (Signed)

## 2018-07-18 NOTE — Progress Notes (Signed)
63 y.o. G66P2012 Married Caucasian female here for annual exam.    Better bladder control.  Not feeling so much abdominal pain.   Lost 50 pounds with [employer].  Gained 8 pounds back.   Retired.  Daughter married last year.   PCP: Burnard Bunting     No LMP recorded. Patient has had a hysterectomy.           Sexually active: No.  The current method of family planning is status post hysterectomy.    Exercising: Yes.    walks regularly Smoker:  no  Health Maintenance: Pap:  12-01-11 Neg, 07/14/17 - negative.  History of abnormal Pap:  Yes, 1985 hx of cryotherapy to cervix for CIN II and CIN III MMG: 06-06-18 3D/poss.0.5 cm reniform mass Lt.Br.,Rt.Neg.06-09-18 3D.   Lt.Diag--Neg/BiRads1/screening 50yr. States she had it done in Jan. 2020.  Colonoscopy: 11/19/16 polyp removed; repeat 5 years BMD:  05/2017 Result :Normal TDaP:  PC Gardasil:   no HIV:no Hep C:no Screening Labs:  Hb today: PCP. Flu vaccine: done.    reports that she has never smoked. She has never used smokeless tobacco. She reports current alcohol use of about 1.0 standard drinks of alcohol per week. She reports that she does not use drugs.  Past Medical History:  Diagnosis Date  . Abnormal Pap smear of cervix 1986   --hx cryotherapy to cervix for CIN II and CIN III  . Allergy   . Arthritis   . Colon polyps    adenomatous  . Diverticulosis     Past Surgical History:  Procedure Laterality Date  . ABDOMINAL HYSTERECTOMY  1995   TVH--ovaries remain  . AUGMENTATION MAMMAPLASTY  8502   silicone  . COLONOSCOPY    . CYSTOSCOPY  03/08/2012   Procedure: CYSTOSCOPY;  Surgeon: Reece Packer, MD;  Location: [facility];  Service: Urology;  Laterality: N/A;  . RECTOCELE REPAIR  03/08/2012   Procedure: POSTERIOR REPAIR (RECTOCELE);  Surgeon: Reece Packer, MD;  Location: [facility];  Service: Urology;  Laterality: N/A;  . VAGINAL PROLAPSE REPAIR  03/08/2012   Procedure: VAGINAL VAULT SUSPENSION;  Surgeon: Reece Packer, MD;  Location: [facility];  Service: Urology;  Laterality: N/A;  with Graft    No current outpatient medications on file.   No current facility-administered medications for this visit.     Family History  Problem Relation Age of Onset  . Thyroid disease Mother        goiter  . Stroke Father   . Heart attack Father   . Hypertension Brother   . Heart disease Maternal Grandfather   . Colon cancer Neg Hx   . Stomach cancer Neg Hx   . Esophageal cancer Neg Hx   . Rectal cancer Neg Hx     Review of Systems  All other systems reviewed and are negative.   Exam:   BP 122/62 (BP Location: Right Arm, Patient Position: Sitting, Cuff Size: Normal)   Pulse 64   Resp 14   Ht 5' 5.5" (1.664 m)   Wt 158 lb 9.6 oz (71.9 kg)   BMI 25.99 kg/m     General appearance: alert, cooperative and appears stated age Head: Normocephalic, without obvious abnormality, atraumatic Neck: no adenopathy, supple, symmetrical, trachea midline and thyroid normal to inspection and palpation Lungs: clear to auscultation bilaterally Breasts: right - implant noted, no masses or tenderness, No nipple retraction or dimpling, No nipple discharge or bleeding, No axillary or supraclavicular adenopathy Left -  implant noted, 2 x 1 cm mass at 8:00, nontender.  No nipple retraction or dimpling, No nipple discharge or bleeding, No axillary or supraclavicular adenopathy Heart: regular rate and rhythm Abdomen: soft, non-tender; no masses, no organomegaly Extremities: extremities normal, atraumatic, no cyanosis or edema Skin: Skin color, texture, turgor normal. No rashes or lesions Lymph nodes: Cervical, supraclavicular, and axillary nodes normal. No abnormal inguinal nodes palpated Neurologic: Grossly normal  Pelvic: External genitalia:  no lesions              Urethra:  normal appearing urethra with no masses, tenderness or lesions              Bartholins and Skenes: normal                 Vagina: normal  appearing vagina with normal color and discharge, no lesions              Cervix:  absent              Pap taken: Yes.   Bimanual Exam:  Uterus:   absent              Adnexa: no mass, fullness, tenderness              Rectal exam: Yes.  .  Confirms.              Anus:  normal sphincter tone, no lesions  Chaperone was present for exam.  Assessment:   Well woman visit with normal exam. Status post TVH.  Hx CIN II/III.  Ovaries remain.  Status post rectocele repair with vaginal vault suspension. Urinary incontinence. Probable liver hemangioma.   Intentional weight loss.  Left breast mass at 8:00.  Plan: Mammogram dx left and left breast US at [facility].  Recommended self breast awareness. Pap and HR HPV as above. Guidelines for Calcium, Vitamin D, regular exercise program including cardiovascular and weight bearing exercise.   Follow up annually and prn.   After visit summary provided.

## 2018-07-21 LAB — CYTOLOGY - PAP
DIAGNOSIS: NEGATIVE
HPV (WINDOPATH): NOT DETECTED

## 2018-07-26 ENCOUNTER — Encounter: Payer: Self-pay | Admitting: Obstetrics and Gynecology

## 2018-07-26 DIAGNOSIS — Z803 Family history of malignant neoplasm of breast: Secondary | ICD-10-CM | POA: Diagnosis not present

## 2018-07-26 DIAGNOSIS — N6489 Other specified disorders of breast: Secondary | ICD-10-CM | POA: Diagnosis not present

## 2018-07-26 DIAGNOSIS — N6002 Solitary cyst of left breast: Secondary | ICD-10-CM | POA: Diagnosis not present

## 2018-07-26 DIAGNOSIS — R634 Abnormal weight loss: Secondary | ICD-10-CM | POA: Diagnosis not present

## 2018-07-26 DIAGNOSIS — N6324 Unspecified lump in the left breast, lower inner quadrant: Secondary | ICD-10-CM | POA: Diagnosis not present

## 2018-08-04 ENCOUNTER — Telehealth: Payer: Self-pay | Admitting: Emergency Medicine

## 2018-08-04 NOTE — Telephone Encounter (Signed)
Chief Complaint  Patient presents with  . Appointment  . Breast Problem    Follow up Breast check/tf    Patient had Breast Imaging at [facility] on 07/26/2018.  Note from Dr. Quincy Simmonds to schedule Breast Check.   Breast check scheduled for 09/07/2018.  Will call back with any concerns.  Encounter closed.

## 2018-09-07 ENCOUNTER — Encounter: Payer: Self-pay | Admitting: Obstetrics and Gynecology

## 2018-09-07 ENCOUNTER — Other Ambulatory Visit: Payer: Self-pay

## 2018-09-07 ENCOUNTER — Ambulatory Visit: Payer: BLUE CROSS/BLUE SHIELD | Admitting: Obstetrics and Gynecology

## 2018-09-07 VITALS — BP 112/60 | HR 80 | Resp 16 | Ht 65.0 in | Wt 156.0 lb

## 2018-09-07 DIAGNOSIS — N6320 Unspecified lump in the left breast, unspecified quadrant: Secondary | ICD-10-CM | POA: Diagnosis not present

## 2018-09-07 NOTE — Progress Notes (Signed)
GYNECOLOGY  VISIT   HPI: 63 y.o.   Married  Caucasian  female   [phone] with No LMP recorded. Patient has had a hysterectomy.   here for left breast recheck    Noted to have a breast lump at her annual exam on 07/18/18: Left breast - implant noted, 2 x 1 cm mass at 8:00, nontender.  No nipple retraction or dimpling, No nipple discharge or bleeding, No axillary or supraclavicular adenopathy  Dx left mammogram and left breast US  Diagnosed with benign cysts of the left upper outer breast.   GYNECOLOGIC HISTORY: No LMP recorded. Patient has had a hysterectomy. Contraception:  Hysterectomy Menopausal hormone therapy:  none Last mammogram:  06-06-18 3D/poss.0.5 cm reniform mass Lt.Br.,Rt.Neg.06-09-18 3D.   Lt.Diag--Neg/BiRads1/screening 75yr. Last pap smear:  07/18/18 Neg:Neg HR HPV; 07/14/17 - negative.  History of abnormal Pap:  Yes, 1985 hx of cryotherapy to cervix for CIN II and CIN III        OB History    Gravida  3   Para  2   Term  2   Preterm      AB  1   Living  2     SAB      TAB      Ectopic      Multiple      Live Births                 Patient Active Problem List   Diagnosis Date Noted  . Liver hemangioma 07/16/2017    Past Medical History:  Diagnosis Date  . Abnormal Pap smear of cervix 1986   --hx cryotherapy to cervix for CIN II and CIN III  . Allergy   . Arthritis   . Colon polyps    adenomatous  . Diverticulosis     Past Surgical History:  Procedure Laterality Date  . ABDOMINAL HYSTERECTOMY  1995   TVH--ovaries remain  . AUGMENTATION MAMMAPLASTY  6203   silicone  . COLONOSCOPY    . CYSTOSCOPY  03/08/2012   Procedure: CYSTOSCOPY;  Surgeon: Reece Packer, MD;  Location: [facility];  Service: Urology;  Laterality: N/A;  . RECTOCELE REPAIR  03/08/2012   Procedure: POSTERIOR REPAIR (RECTOCELE);  Surgeon: Reece Packer, MD;  Location: [facility];  Service: Urology;  Laterality: N/A;  . VAGINAL PROLAPSE REPAIR  03/08/2012   Procedure:  VAGINAL VAULT SUSPENSION;  Surgeon: Reece Packer, MD;  Location: [facility];  Service: Urology;  Laterality: N/A;  with Graft    No current outpatient medications on file.   No current facility-administered medications for this visit.      ALLERGIES: Codeine  Family History  Problem Relation Age of Onset  . Thyroid disease Mother        goiter  . Stroke Father   . Heart attack Father   . Hypertension Brother   . Heart disease Maternal Grandfather   . Colon cancer Neg Hx   . Stomach cancer Neg Hx   . Esophageal cancer Neg Hx   . Rectal cancer Neg Hx     Social History   Socioeconomic History  . Marital status: Married    Spouse name: Not on file  . Number of children: Not on file  . Years of education: Not on file  . Highest education level: Not on file  Occupational History  . Not on file  Social Needs  . Financial resource strain: Not on file  . Food insecurity:  Worry: Not on file    Inability: Not on file  . Transportation needs:    Medical: Not on file    Non-medical: Not on file  Tobacco Use  . Smoking status: Never Smoker  . Smokeless tobacco: Never Used  Substance and Sexual Activity  . Alcohol use: Yes    Alcohol/week: 1.0 standard drinks    Types: 1 Standard drinks or equivalent per week    Comment: OCCASIONAL-wine  . Drug use: No  . Sexual activity: Not Currently    Partners: Male    Comment: TVH--still has ovaries, husband with prostate cancer  Lifestyle  . Physical activity:    Days per week: Not on file    Minutes per session: Not on file  . Stress: Not on file  Relationships  . Social connections:    Talks on phone: Not on file    Gets together: Not on file    Attends religious service: Not on file    Active member of club or organization: Not on file    Attends meetings of clubs or organizations: Not on file    Relationship status: Not on file  . Intimate partner violence:    Fear of current or ex partner: Not on file     Emotionally abused: Not on file    Physically abused: Not on file    Forced sexual activity: Not on file  Other Topics Concern  . Not on file  Social History Narrative  . Not on file    Review of Systems  Constitutional: Negative.   HENT: Negative.   Eyes: Negative.   Respiratory: Negative.   Cardiovascular: Negative.   Gastrointestinal: Negative.   Endocrine: Negative.   Genitourinary: Negative.   Musculoskeletal: Negative.   Skin: Negative.   Allergic/Immunologic: Negative.   Neurological: Negative.   Hematological: Negative.   Psychiatric/Behavioral: Negative.     PHYSICAL EXAMINATION:    BP 112/60 (BP Location: Right Arm, Patient Position: Sitting, Cuff Size: Normal)   Pulse 80   Resp 16   Ht 5\' 5"  (1.651 m)   Wt 156 lb (70.8 kg)   BMI 25.96 kg/m     General appearance: alert, cooperative and appears stated age   Breasts: bilateral implants.   Right - normal appearance, no masses or tenderness, No nipple retraction or dimpling, No nipple discharge or bleeding, No axillary or supraclavicular adenopathy Left with 1.5 x 1 cm smooth mobile mass at 8:00, just outside implant.  No nipple retraction or dimpling, No nipple discharge or bleeding, No axillary or supraclavicular adenopathy.  Chaperone was present for exam.  ASSESSMENT  Left breast mass with negative imaging.  Bilateral breast implants.   PLAN  We discussed false positives and false negatives with breast imaging. I discussed going back for additional US imaging versus consultation with general surgeon.  Will have patient see general surgery.    An After Visit Summary was printed and given to the patient.  __15____ minutes face to face time of which over 50% was spent in counseling.

## 2018-09-07 NOTE — Progress Notes (Signed)
Spoke with patient while in office. Patient scheduled at [facility] on 09/20/18 at 1010 with Dr. Marlou Starks. Spoke with Clarise Cruz, order for referral placed in Epic. Patient verbalizes understanding and is agreeable.

## 2018-09-20 DIAGNOSIS — N6320 Unspecified lump in the left breast, unspecified quadrant: Secondary | ICD-10-CM | POA: Diagnosis not present

## 2018-09-28 ENCOUNTER — Other Ambulatory Visit: Payer: Self-pay | Admitting: General Surgery

## 2018-09-28 DIAGNOSIS — N6320 Unspecified lump in the left breast, unspecified quadrant: Secondary | ICD-10-CM

## 2018-10-25 ENCOUNTER — Ambulatory Visit
Admission: RE | Admit: 2018-10-25 | Discharge: 2018-10-25 | Disposition: A | Discharge status: Home / Self Care | Payer: BLUE CROSS/BLUE SHIELD | Source: Ambulatory Visit | Attending: General Surgery | Admitting: General Surgery

## 2018-10-25 ENCOUNTER — Other Ambulatory Visit: Payer: Self-pay

## 2018-10-25 DIAGNOSIS — N6320 Unspecified lump in the left breast, unspecified quadrant: Secondary | ICD-10-CM | POA: Diagnosis not present

## 2018-10-25 MED ORDER — GADOBUTROL 1 MMOL/ML IV SOLN
7.0000 mL | Freq: Once | INTRAVENOUS | Status: AC | PRN
  Administered 2018-10-25: 7 mL via INTRAVENOUS

## 2018-11-24 IMAGING — CT CT ABD-PELV W/ CM
2 of 5 series · 16 of 46 positions shown, 18 images · IV contrast (iopamidol)
Comparison: 03/11/2016

CLINICAL DATA: Abdominal pain. Right lower quadrant pain for
several months.

EXAM:
CT ABDOMEN AND PELVIS WITH CONTRAST
TECHNIQUE: Multidetector CT imaging of the abdomen and pelvis was performed
using the standard protocol following bolus administration of
intravenous contrast.
CONTRAST:  100mL BEFP05-AJJ IOPAMIDOL (BEFP05-AJJ) INJECTION 61%

[Series 3: abdomen 5.0 · axial · 0.86mm/px · z∈[+685,+1050]mm · 13 of 87 slices shown, 15 images]
[im 7/87  soft-tissue]
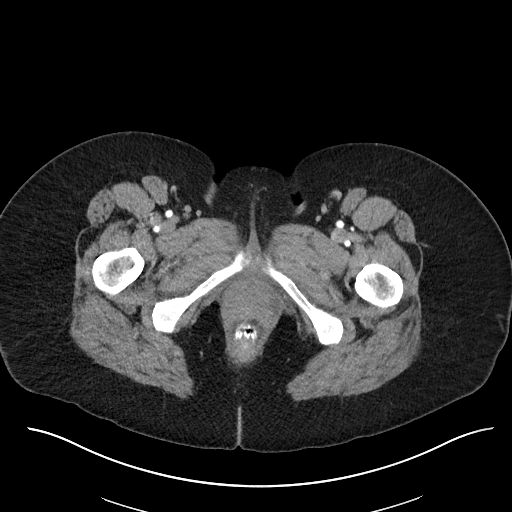
[im 7/87  bone]
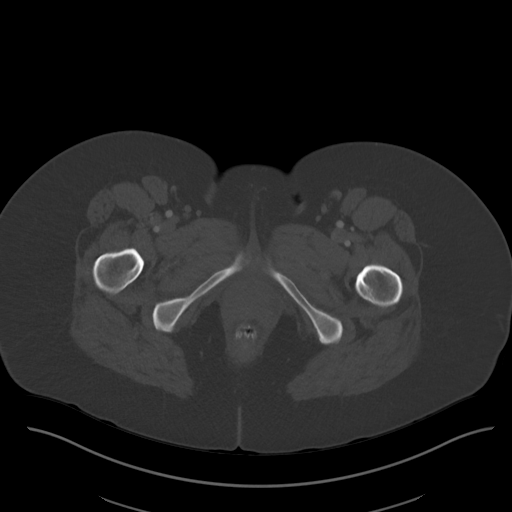
[im 13/87  soft-tissue]
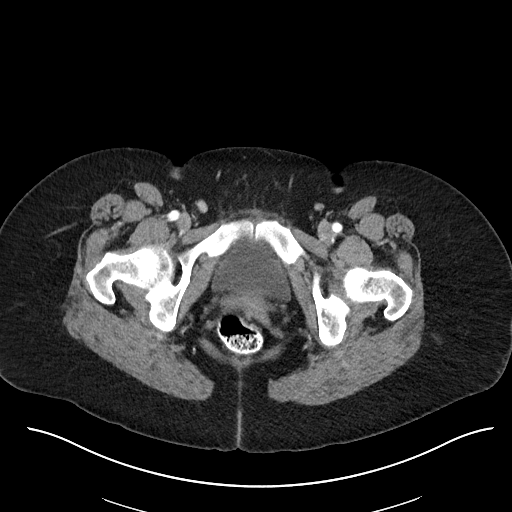
[im 19/87  soft-tissue]
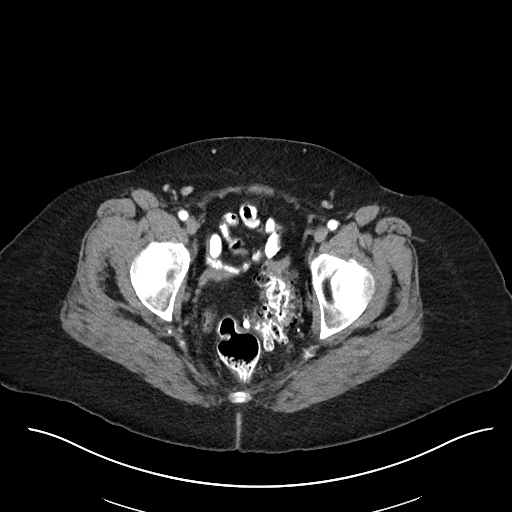
[im 25/87  soft-tissue]
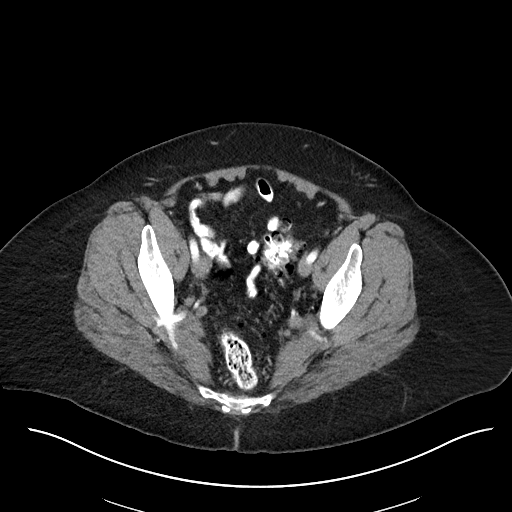
[im 31/87  soft-tissue]
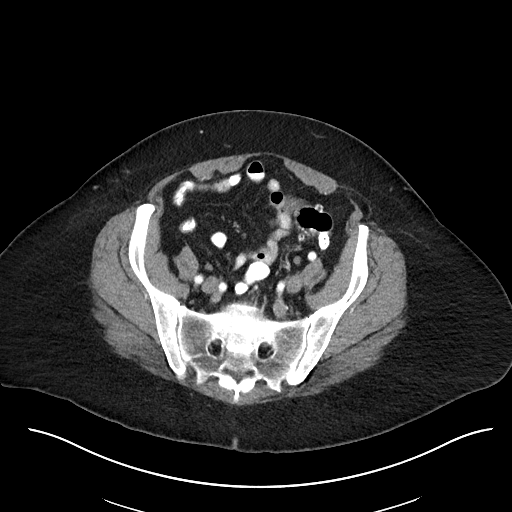
[im 37/87  soft-tissue]
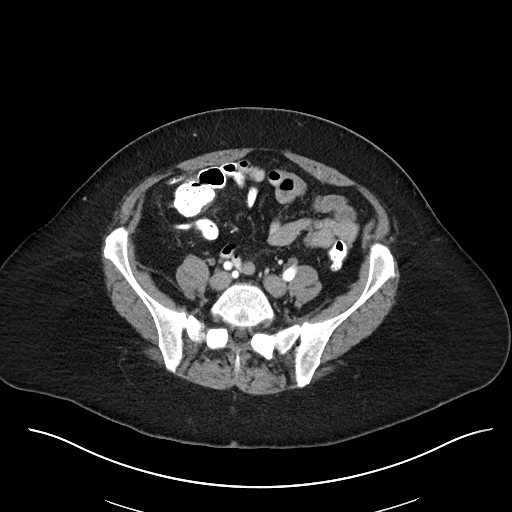
[im 44/87  soft-tissue]
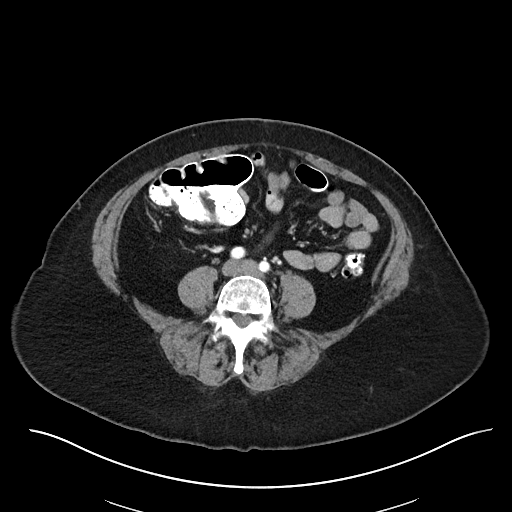
[im 50/87  soft-tissue]
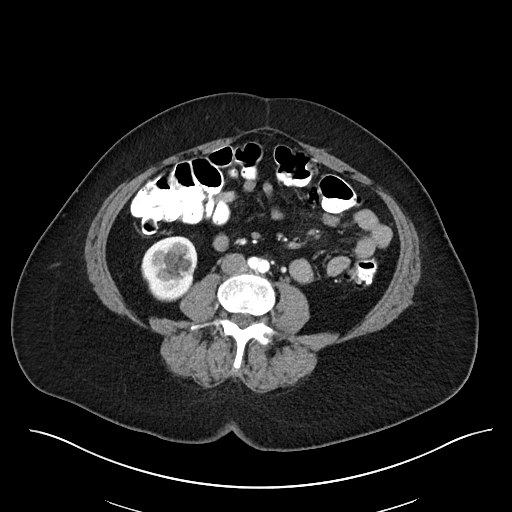
[im 56/87  soft-tissue]
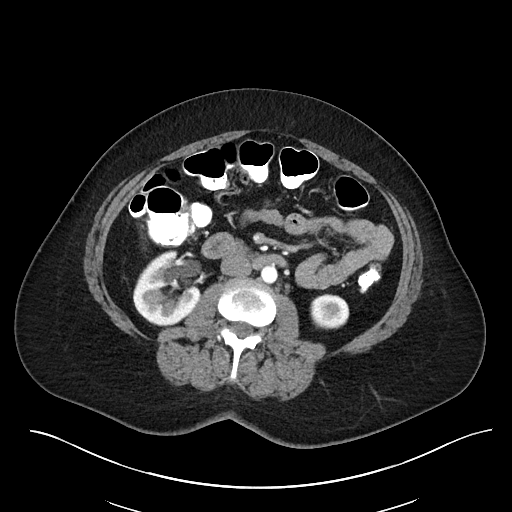
[im 56/87  bone]
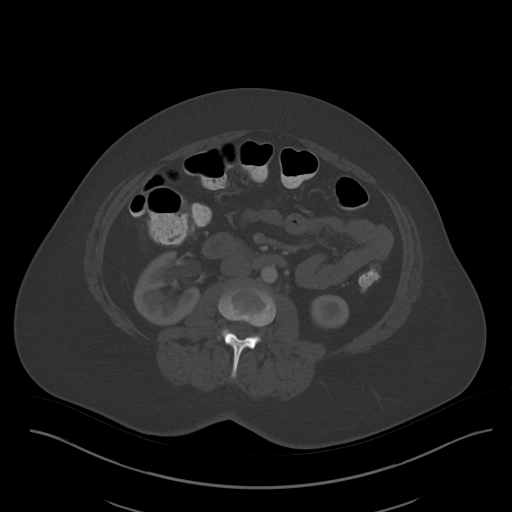
[im 62/87  soft-tissue]
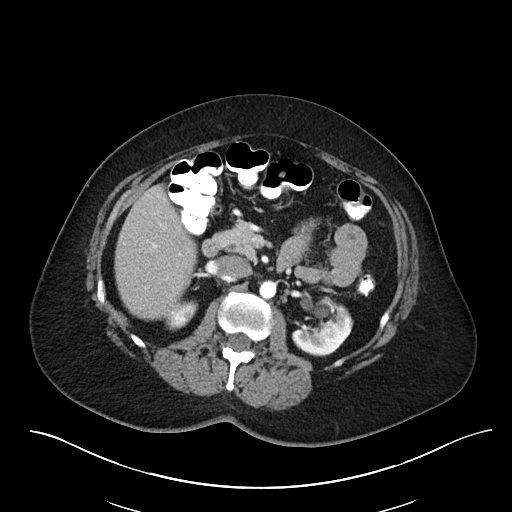
[im 68/87  soft-tissue]
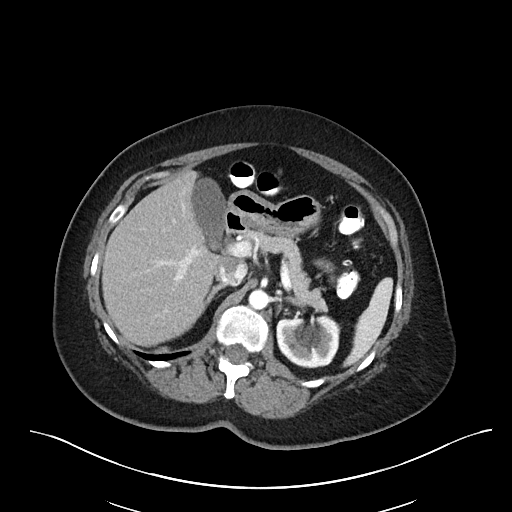
[im 74/87  soft-tissue]
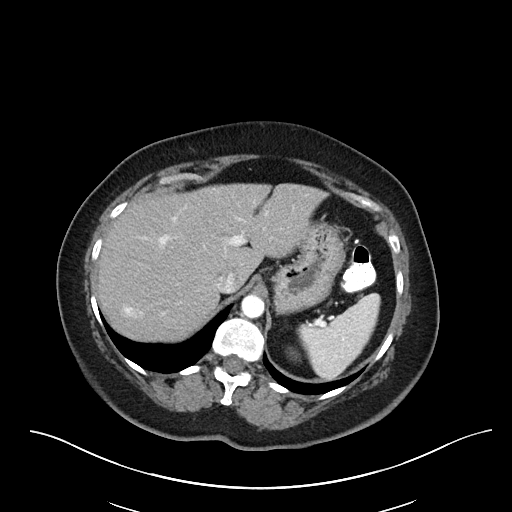
[im 80/87  soft-tissue]
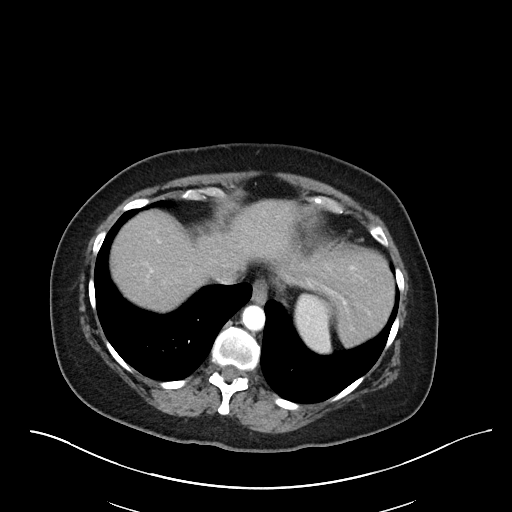

[Series 6: abdomen 3.0 mpr cor · coronal · 0.72mm/px · 3 of 101 slices shown]
[im 34/101  soft-tissue]
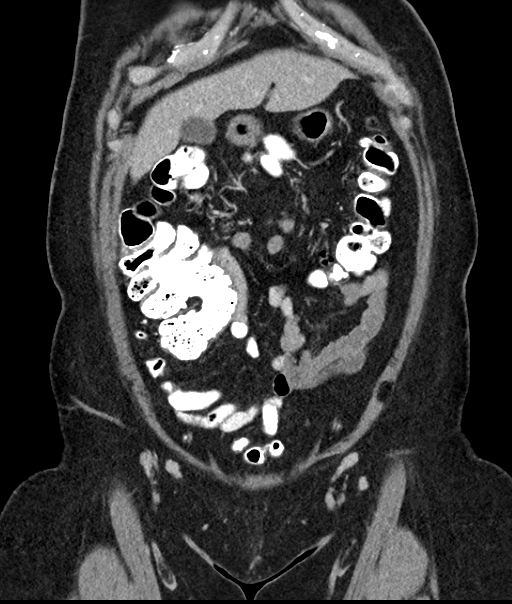
[im 45/101  soft-tissue]
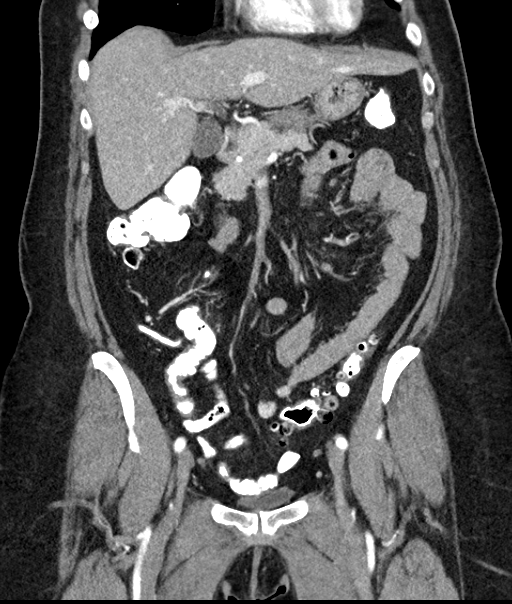
[im 56/101  soft-tissue]
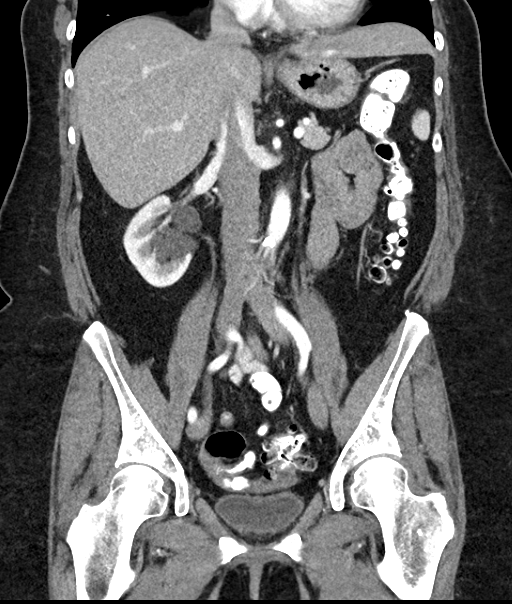

[16 of 46 positions shown; findings below may reference images not displayed]

FINDINGS: Lower chest: No acute abnormality.

Hepatobiliary: Enhancing structure within the posterior right lobe
of liver is again identified and is favored to represent a flash
fill hemangioma. No new focal liver abnormalities. The gallbladder
appears within normal limits. No biliary dilatation.

Pancreas: Unremarkable. No pancreatic ductal dilatation or
surrounding inflammatory changes.

Spleen: Normal in size without focal abnormality.

Adrenals/Urinary Tract: The adrenal glands are normal. Scarring
identified within the upper pole of left kidney. Bilateral renal
sinus cysts identified. No mass or hydronephrosis. Urinary bladder
appears normal.

Stomach/Bowel: The stomach appears normal. The small bowel loops
have a normal course and caliber without evidence for bowel
obstruction. The appendix is visualized and appears normal. Normal
distal colonic diverticular noted without acute inflammation.

Vascular/Lymphatic: Normal appearance of the abdominal aorta. No
aneurysm. No enlarged upper abdominal lymph nodes. No pelvic or
inguinal adenopathy.

Reproductive: Status post hysterectomy. No adnexal masses.

Other: No abdominal wall hernia or abnormality. No abdominopelvic
ascites.

Musculoskeletal: No acute or significant osseous findings.
IMPRESSION: 1. No acute findings identified within the abdomen or pelvis.
2. The appendix is visualized and appears within normal limits.
3. Distal colonic diverticulosis noted without acute inflammation.

## 2018-12-01 ENCOUNTER — Encounter: Payer: Self-pay | Admitting: Obstetrics and Gynecology

## 2018-12-01 DIAGNOSIS — N6001 Solitary cyst of right breast: Secondary | ICD-10-CM | POA: Diagnosis not present

## 2019-01-26 ENCOUNTER — Telehealth: Payer: Self-pay | Admitting: *Deleted

## 2019-01-26 NOTE — Telephone Encounter (Signed)
Patient seen in office on 09/07/18 for left breast mass.  [facility] hold for breast MRI on 10/25/18 for left breast mass, ordered by Dr. Marlou Starks.   Dr. Quincy Simmonds -ok to remove from hold? Please advise.

## 2019-01-29 NOTE — Telephone Encounter (Signed)
Please contact Dr. Ethlyn Gallery office to get a copy of her visit with him, so we are up to date on her plan of care.  It looks like she may have been referred to a Psychiatric nurse.

## 2019-01-30 NOTE — Telephone Encounter (Signed)
09/20/2018 [facility] OV notes received. Copy to review and sign for scan.  Breast MRI recommended  Referred to Plastic Surgery Return for f/u with [facility] in 3 months  Call placed to patient. Left message to call Sharee Pimple, RN at [facility].   Did patient f/u with plastic surgeon for left breast mass?

## 2019-01-30 NOTE — Telephone Encounter (Signed)
Routing to [place] for assistance with records request.

## 2019-01-30 NOTE — Telephone Encounter (Signed)
Spoke with patient. Patient states she spoke with [facility] by phone a few days after 10/25/18 breast MRI. Patient will have f/u with Dr. Marlou Starks 02/2019, has declined referral or f/u with plastic surgery. Patient states she will further discuss with Dr. Marlou Starks at f/u. Advised I will update Dr. Quincy Simmonds and return call if any additional recommendations.   Routing to Dr. Quincy Simmonds.

## 2019-01-30 NOTE — Telephone Encounter (Signed)
Patient is returning a call to Jill. °

## 2019-02-06 NOTE — Telephone Encounter (Signed)
Ok to remove from mammogram hold.  Dr. Marlou Starks is following her for a breast MRI he ordered.  She will follow up with him in September.

## 2019-02-09 NOTE — Telephone Encounter (Signed)
Patient removed from [facility] hold.   Encounter closed.  

## 2019-02-28 DIAGNOSIS — N6320 Unspecified lump in the left breast, unspecified quadrant: Secondary | ICD-10-CM | POA: Diagnosis not present

## 2019-03-06 DIAGNOSIS — E7849 Other hyperlipidemia: Secondary | ICD-10-CM | POA: Diagnosis not present

## 2019-03-06 DIAGNOSIS — Z0000 Encounter for general adult medical examination without abnormal findings: Secondary | ICD-10-CM | POA: Diagnosis not present

## 2019-03-07 DIAGNOSIS — Z23 Encounter for immunization: Secondary | ICD-10-CM | POA: Diagnosis not present

## 2019-03-09 ENCOUNTER — Encounter: Payer: Self-pay | Admitting: Obstetrics and Gynecology

## 2019-03-09 DIAGNOSIS — K589 Irritable bowel syndrome without diarrhea: Secondary | ICD-10-CM | POA: Diagnosis not present

## 2019-03-09 DIAGNOSIS — Z0000 Encounter for general adult medical examination without abnormal findings: Secondary | ICD-10-CM | POA: Diagnosis not present

## 2019-03-09 DIAGNOSIS — Z1212 Encounter for screening for malignant neoplasm of rectum: Secondary | ICD-10-CM | POA: Diagnosis not present

## 2019-03-09 DIAGNOSIS — E785 Hyperlipidemia, unspecified: Secondary | ICD-10-CM | POA: Diagnosis not present

## 2019-03-09 DIAGNOSIS — E663 Overweight: Secondary | ICD-10-CM | POA: Diagnosis not present

## 2019-03-09 DIAGNOSIS — N6311 Unspecified lump in the right breast, upper outer quadrant: Secondary | ICD-10-CM | POA: Diagnosis not present

## 2019-03-17 DIAGNOSIS — Z86018 Personal history of other benign neoplasm: Secondary | ICD-10-CM | POA: Diagnosis not present

## 2019-03-17 DIAGNOSIS — D225 Melanocytic nevi of trunk: Secondary | ICD-10-CM | POA: Diagnosis not present

## 2019-03-17 DIAGNOSIS — Z85828 Personal history of other malignant neoplasm of skin: Secondary | ICD-10-CM | POA: Diagnosis not present

## 2019-03-17 DIAGNOSIS — L570 Actinic keratosis: Secondary | ICD-10-CM | POA: Diagnosis not present

## 2019-03-17 DIAGNOSIS — D2261 Melanocytic nevi of right upper limb, including shoulder: Secondary | ICD-10-CM | POA: Diagnosis not present

## 2019-06-08 ENCOUNTER — Ambulatory Visit: Payer: BLUE CROSS/BLUE SHIELD | Admitting: Orthopaedic Surgery

## 2019-06-08 ENCOUNTER — Encounter: Payer: Self-pay | Admitting: Orthopaedic Surgery

## 2019-06-08 ENCOUNTER — Ambulatory Visit: Payer: Self-pay

## 2019-06-08 ENCOUNTER — Other Ambulatory Visit: Payer: Self-pay

## 2019-06-08 VITALS — Ht 65.75 in | Wt 153.0 lb

## 2019-06-08 DIAGNOSIS — M25561 Pain in right knee: Secondary | ICD-10-CM | POA: Insufficient documentation

## 2019-06-08 DIAGNOSIS — G8929 Other chronic pain: Secondary | ICD-10-CM

## 2019-06-08 MED ORDER — METHYLPREDNISOLONE ACETATE 40 MG/ML IJ SUSP
80.0000 mg | INTRAMUSCULAR | Status: AC | PRN
  Administered 2019-06-08: 16:00:00 80 mg via INTRA_ARTICULAR

## 2019-06-08 MED ORDER — BUPIVACAINE HCL 0.5 % IJ SOLN
2.0000 mL | INTRAMUSCULAR | Status: AC | PRN
  Administered 2019-06-08: 16:00:00 2 mL via INTRA_ARTICULAR

## 2019-06-08 MED ORDER — LIDOCAINE HCL 1 % IJ SOLN
2.0000 mL | INTRAMUSCULAR | Status: AC | PRN
  Administered 2019-06-08: 2 mL

## 2019-06-08 NOTE — Progress Notes (Signed)
Office Visit Note   Patient: Karen Todd           Date of Birth: 1956-02-10           MRN: [id number] Visit Date: 06/08/2019              Requested by: Burnard Bunting, MD [address] [place],  [place] [address] PCP: Burnard Bunting, MD   Assessment & Plan: Visit Diagnoses:  1. Chronic pain of right knee     Plan: Lateral proximal right knee pain could be referred from the patella or possibly soft tissue abnormality in the area of the IT band insertion.  Exam was benign.  X-rays were essentially benign laterally.  There is mild arthritis in the medial compartment where she is not symptomatic.  I am going to inject the area of greatest tenderness with Xylocaine, Marcaine and Depo-Medrol and monitor response.  Follow-Up Instructions: Return if symptoms worsen or fail to improve.   Orders:  Orders Placed This Encounter  Procedures  . Large Joint Inj: R knee  . XR KNEE 3 VIEW RIGHT   No orders of the defined types were placed in this encounter.     Procedures: Large Joint Inj: R knee on 06/08/2019 3:57 PM Indications: pain and diagnostic evaluation Details: 25 G 1.5 in needle, anterolateral approach  Arthrogram: No  Medications: 2 mL lidocaine 1 %; 2 mL bupivacaine 0.5 %; 80 mg methylPREDNISolone acetate 40 MG/ML  Injected area of greatest tenderness along the lateral proximal tibial plateau Procedure, treatment alternatives, risks and benefits explained, specific risks discussed. Consent was given by the patient. Immediately prior to procedure a time out was called to verify the correct patient, procedure, equipment, support staff and site/side marked as required. Patient was prepped and draped in the usual sterile fashion.       Clinical Data: No additional findings.   Subjective: Chief Complaint  Patient presents with  . Right Knee - Pain  Patient presents today for right knee pain. She said that it hurts anteriorly if she kneels down. It has been  bothering her for two months. Not taking anything for pain. Does not bother her at all otherwise.  The knee has not been hot red or swollen.  No effusion.  No instability or sensation of knee giving way.  No injury or trauma.  Pain seems to be fairly well localized along the proximal lateral tibial plateau but tickly with deep knee bend and squat.  HPI  Review of Systems  Constitutional: Negative for fatigue.  HENT: Negative for ear pain.   Eyes: Negative for pain.  Respiratory: Negative for shortness of breath.   Cardiovascular: Negative for leg swelling.  Gastrointestinal: Negative for constipation and diarrhea.  Endocrine: Negative for cold intolerance and heat intolerance.  Genitourinary: Negative for difficulty urinating.  Musculoskeletal: Negative for joint swelling.  Skin: Negative for rash.  Allergic/Immunologic: Negative for food allergies.  Neurological: Negative for weakness.  Hematological: Does not bruise/bleed easily.  Psychiatric/Behavioral: Negative for sleep disturbance.     Objective: Vital Signs: Ht 5' 5.75" (1.67 m)   Wt 153 lb (69.4 kg)   BMI 24.88 kg/m   Physical Exam Constitutional:      Appearance: She is well-developed.  Eyes:     Pupils: Pupils are equal, round, and reactive to light.  Pulmonary:     Effort: Pulmonary effort is normal.  Skin:    General: Skin is warm and dry.  Neurological:     Mental  Status: She is alert and oriented to person, place, and time.  Psychiatric:        Behavior: Behavior normal.     Ortho Exam awake alert and oriented x3.  Comfortable sitting.  Right knee was not hot red warm or swollen.  No effusion.  Little bit of patella crepitation but no pain with patella compression.  No pain over either medial lateral joint space.  Full extension and flexion.  She could reproduce the pain along the lateral proximal tibial plateau with about 30 degrees of forced flexion.  No pain along the IT band.  No posterior pain.  No calf  pain.  No distal edema.  Neurologically intact.  No masses or crepitation in the area of the discomfort which was localized to the size of a nickel  Specialty Comments:  No specialty comments available.  Imaging: XR KNEE 3 VIEW RIGHT  Result Date: 06/08/2019 Films of the right knee were obtained in 3 projections standing.  There are some mild varus position but the joint spaces well-maintained.  Some subchondral sclerosis beneath the femur and tibia in the medial joint surface.  No osteophytes.  No ectopic calcification or acute change.  Some hypertrophy of the lateral patella facet but no significant tilt.  Films are consistent with mild osteoarthritis without any acute change or ectopic calcification    PMFS History: Patient Active Problem List   Diagnosis Date Noted  . Pain in right knee 06/08/2019  . Liver hemangioma 07/16/2017   Past Medical History:  Diagnosis Date  . Abnormal Pap smear of cervix 1986   --hx cryotherapy to cervix for CIN II and CIN III  . Allergy   . Arthritis   . Colon polyps    adenomatous  . Diverticulosis     Family History  Problem Relation Age of Onset  . Thyroid disease Mother        goiter  . Stroke Father   . Heart attack Father   . Hypertension Brother   . Heart disease Maternal Grandfather   . Colon cancer Neg Hx   . Stomach cancer Neg Hx   . Esophageal cancer Neg Hx   . Rectal cancer Neg Hx     Past Surgical History:  Procedure Laterality Date  . ABDOMINAL HYSTERECTOMY  1995   TVH--ovaries remain  . AUGMENTATION MAMMAPLASTY  99991111   silicone  . COLONOSCOPY    . CYSTOSCOPY  03/08/2012   Procedure: CYSTOSCOPY;  Surgeon: Reece Packer, MD;  Location: [facility];  Service: Urology;  Laterality: N/A;  . RECTOCELE REPAIR  03/08/2012   Procedure: POSTERIOR REPAIR (RECTOCELE);  Surgeon: Reece Packer, MD;  Location: [facility];  Service: Urology;  Laterality: N/A;  . VAGINAL PROLAPSE REPAIR  03/08/2012   Procedure: VAGINAL VAULT  SUSPENSION;  Surgeon: Reece Packer, MD;  Location: [facility];  Service: Urology;  Laterality: N/A;  with Graft   Social History   Occupational History  . Not on file  Tobacco Use  . Smoking status: Never Smoker  . Smokeless tobacco: Never Used  Substance and Sexual Activity  . Alcohol use: Yes    Alcohol/week: 1.0 standard drinks    Types: 1 Standard drinks or equivalent per week    Comment: OCCASIONAL-wine  . Drug use: No  . Sexual activity: Not Currently    Partners: Male    Comment: TVH--still has ovaries, husband with prostate cancer

## 2019-07-19 NOTE — Progress Notes (Deleted)
64 y.o. G11P2012 Married Caucasian female here for annual exam.    PCP:     No LMP recorded. Patient has had a hysterectomy.           Sexually active: {yes no:314532}  The current method of family planning is status post hysterectomy.    Exercising: {yes no:314532}  {types:19826} Smoker:  no  Health Maintenance: Pap:07-18-18 Neg:Neg HR HPV, 12-01-11 Neg, 07/14/17 Neg History of abnormal Pap:  Yes, 1985 hx of cryotherapy to cervix for CIN II and CIN III [facility]: Br.MRI 10-25-18 52mm mass Rt.Br.,Lt.neg/density C/BiRads4--rec.Rt.Br.US. Rt.Br.US benign simple cyst/follow up [facility] & Rt.Br.US 6 months/BiRads2 Colonoscopy: 11/19/16 polyp removed; repeat 5 years BMD: 05/2017  Result :Normal TDaP:  PCP Gardasil:   no HIV:no Hep C:no Screening Labs:  Hb today: ***, Urine today: ***   reports that she has never smoked. She has never used smokeless tobacco. She reports current alcohol use of about 1.0 standard drinks of alcohol per week. She reports that she does not use drugs.  Past Medical History:  Diagnosis Date  . Abnormal Pap smear of cervix 1986   --hx cryotherapy to cervix for CIN II and CIN III  . Allergy   . Arthritis   . Colon polyps    adenomatous  . Diverticulosis     Past Surgical History:  Procedure Laterality Date  . ABDOMINAL HYSTERECTOMY  1995   TVH--ovaries remain  . AUGMENTATION MAMMAPLASTY  99991111   silicone  . COLONOSCOPY    . CYSTOSCOPY  03/08/2012   Procedure: CYSTOSCOPY;  Surgeon: Reece Packer, MD;  Location: [facility];  Service: Urology;  Laterality: N/A;  . RECTOCELE REPAIR  03/08/2012   Procedure: POSTERIOR REPAIR (RECTOCELE);  Surgeon: Reece Packer, MD;  Location: [facility];  Service: Urology;  Laterality: N/A;  . VAGINAL PROLAPSE REPAIR  03/08/2012   Procedure: VAGINAL VAULT SUSPENSION;  Surgeon: Reece Packer, MD;  Location: [facility];  Service: Urology;  Laterality: N/A;  with Graft    No current outpatient medications on file.   No current  facility-administered medications for this visit.    Family History  Problem Relation Age of Onset  . Thyroid disease Mother        goiter  . Stroke Father   . Heart attack Father   . Hypertension Brother   . Heart disease Maternal Grandfather   . Colon cancer Neg Hx   . Stomach cancer Neg Hx   . Esophageal cancer Neg Hx   . Rectal cancer Neg Hx     Review of Systems  Exam:   There were no vitals taken for this visit.    General appearance: alert, cooperative and appears stated age Head: normocephalic, without obvious abnormality, atraumatic Neck: no adenopathy, supple, symmetrical, trachea midline and thyroid normal to inspection and palpation Lungs: clear to auscultation bilaterally Breasts: normal appearance, no masses or tenderness, No nipple retraction or dimpling, No nipple discharge or bleeding, No axillary adenopathy Heart: regular rate and rhythm Abdomen: soft, non-tender; no masses, no organomegaly Extremities: extremities normal, atraumatic, no cyanosis or edema Skin: skin color, texture, turgor normal. No rashes or lesions Lymph nodes: cervical, supraclavicular, and axillary nodes normal. Neurologic: grossly normal  Pelvic: External genitalia:  no lesions              No abnormal inguinal nodes palpated.              Urethra:  normal appearing urethra with no masses, tenderness or lesions  Bartholins and Skenes: normal                 Vagina: normal appearing vagina with normal color and discharge, no lesions              Cervix: no lesions              Pap taken: {yes no:314532} Bimanual Exam:  Uterus:  normal size, contour, position, consistency, mobility, non-tender              Adnexa: no mass, fullness, tenderness              Rectal exam: {yes no:314532}.  Confirms.              Anus:  normal sphincter tone, no lesions  Chaperone was present for exam.  Assessment:   Well woman visit with normal exam.   Plan: Mammogram screening  discussed. Self breast awareness reviewed. Pap and HR HPV as above. Guidelines for Calcium, Vitamin D, regular exercise program including cardiovascular and weight bearing exercise.   Follow up annually and prn.   Additional counseling given.  {yes [id number]. _______ minutes face to face time of which over 50% was spent in counseling.    After visit summary provided.

## 2019-07-20 ENCOUNTER — Ambulatory Visit: Payer: BC Managed Care – PPO | Admitting: Obstetrics and Gynecology

## 2019-08-01 ENCOUNTER — Encounter: Payer: Self-pay | Admitting: Internal Medicine

## 2019-08-01 DIAGNOSIS — N6310 Unspecified lump in the right breast, unspecified quadrant: Secondary | ICD-10-CM | POA: Diagnosis not present

## 2019-08-01 DIAGNOSIS — R928 Other abnormal and inconclusive findings on diagnostic imaging of breast: Secondary | ICD-10-CM | POA: Diagnosis not present

## 2019-08-14 ENCOUNTER — Other Ambulatory Visit: Payer: Self-pay

## 2019-08-15 ENCOUNTER — Encounter: Payer: Self-pay | Admitting: Obstetrics and Gynecology

## 2019-08-15 ENCOUNTER — Ambulatory Visit: Payer: BC Managed Care – PPO | Admitting: Obstetrics and Gynecology

## 2019-08-15 VITALS — BP 118/70 | HR 80 | Temp 97.0°F | Resp 12 | Ht 65.0 in | Wt 159.2 lb

## 2019-08-15 DIAGNOSIS — Z01419 Encounter for gynecological examination (general) (routine) without abnormal findings: Secondary | ICD-10-CM | POA: Diagnosis not present

## 2019-08-15 NOTE — Patient Instructions (Signed)

## 2019-08-15 NOTE — Progress Notes (Signed)
64 y.o. G51P2012 Married Caucasian female here for annual exam.    No problems with bladder or bowel control.  Bought a horse last year and had it temporarily.  Enjoys painting.   PCP:   Burnard Bunting, MD   No LMP recorded. Patient has had a hysterectomy.           Sexually active: No.  The current method of family planning is status post hysterectomy.    Exercising: Yes.    The patient has a physically strenuous job, but has no regular exercise apart from work.  walking Smoker:  no  Health Maintenance: Pap:  07-18-2018 negative, HR HPV negative            07-14-17 negative  History of abnormal Pap:  Yes, 1985 hx of cryotherapy to cervix for CIN II and CIN III MM[facility]:  03-09-2019 Right breast U/S BIRADS 3 probably benign. 2 weeks ago at [facility] per patient  Colonoscopy:  11-19-16 polyp- repeat 5 years.  [facility].   BMD:   05-2017   Result  Normal  TDaP:  PCP Gardasil:   no HIV: no Hep C: no Screening Labs:  Hb today: PCP, Urine today: not collected    reports that she has never smoked. She has never used smokeless tobacco. She reports current alcohol use of about 1.0 standard drinks of alcohol per week. She reports that she does not use drugs.  Past Medical History:  Diagnosis Date  . Abnormal Pap smear of cervix 1986   --hx cryotherapy to cervix for CIN II and CIN III  . Allergy   . Arthritis   . Colon polyps    adenomatous  . Diverticulosis     Past Surgical History:  Procedure Laterality Date  . ABDOMINAL HYSTERECTOMY  1995   TVH--ovaries remain  . AUGMENTATION MAMMAPLASTY  99991111   silicone  . COLONOSCOPY    . CYSTOSCOPY  03/08/2012   Procedure: CYSTOSCOPY;  Surgeon: Reece Packer, MD;  Location: [facility];  Service: Urology;  Laterality: N/A;  . RECTOCELE REPAIR  03/08/2012   Procedure: POSTERIOR REPAIR (RECTOCELE);  Surgeon: Reece Packer, MD;  Location: [facility];  Service: Urology;  Laterality: N/A;  . VAGINAL PROLAPSE REPAIR  03/08/2012   Procedure: VAGINAL  VAULT SUSPENSION;  Surgeon: Reece Packer, MD;  Location: [facility];  Service: Urology;  Laterality: N/A;  with Graft    Current Outpatient Medications  Medication Sig Dispense Refill  . aspirin 81 MG chewable tablet Chew by mouth daily.     No current facility-administered medications for this visit.    Family History  Problem Relation Age of Onset  . Thyroid disease Mother        goiter  . Stroke Father   . Heart attack Father   . Hypertension Brother   . Heart disease Maternal Grandfather   . Colon cancer Neg Hx   . Stomach cancer Neg Hx   . Esophageal cancer Neg Hx   . Rectal cancer Neg Hx     Review of Systems  Constitutional: Negative.   HENT: Negative.   Eyes: Negative.   Respiratory: Negative.   Cardiovascular: Negative.   Gastrointestinal: Negative.   Endocrine: Negative.   Genitourinary: Negative.   Musculoskeletal: Negative.   Skin: Negative.   Allergic/Immunologic: Negative.   Neurological: Negative.   Hematological: Negative.   Psychiatric/Behavioral: Negative.     Exam:   BP 118/70 (BP Location: Right Arm, Patient Position: Sitting, Cuff Size: Normal)  Pulse 80   Temp (!) 97 F (36.1 C) (Skin)   Resp 12   Ht 5\' 5"  (1.651 m)   Wt 159 lb 4 oz (72.2 kg)   BMI 26.50 kg/m     General appearance: alert, cooperative and appears stated age Head: normocephalic, without obvious abnormality, atraumatic Neck: no adenopathy, supple, symmetrical, trachea midline and thyroid normal to inspection and palpation Lungs: clear to auscultation bilaterally Breasts: normal appearance, Bilateral implants, no masses or tenderness, No nipple retraction or dimpling, No nipple discharge or bleeding, No axillary adenopathy Heart: regular rate and rhythm Abdomen: soft, non-tender; no masses, no organomegaly Extremities: extremities normal, atraumatic, no cyanosis or edema Skin: skin color, texture, turgor normal. No rashes or lesions Lymph nodes: cervical,  supraclavicular, and axillary nodes normal. Neurologic: grossly normal  Pelvic: External genitalia:  no lesions              No abnormal inguinal nodes palpated.              Urethra:  normal appearing urethra with no masses, tenderness or lesions              Bartholins and Skenes: normal                 Vagina: normal appearing vagina with normal color and discharge, no lesions              Cervix: absent.  Small 4 mm cyst of the vaginal mucosa - palpable only.  Tender. Not visible.               Pap taken: No. Bimanual Exam:  Uterus:  Absent.              Adnexa: no mass, fullness, tenderness              Rectal exam: Yes.  .  Confirms.              Anus:  normal sphincter tone, no lesions  Chaperone was present for exam.  Assessment:   Well woman visit with normal exam. Status post TVH.  Vaginal apical cyst. Hx CIN II/III.  Ovaries remain. Status post rectocele repair with vaginal vault suspension. Urinary incontinence. Probable liver hemangioma. Intentional weight loss.   Plan: Mammogram screening discussed.  Will get a copy of her latest imaging.  Self breast awareness reviewed. Pap and HR HPV as above. Guidelines for Calcium, Vitamin D, regular exercise program including cardiovascular and weight bearing exercise. Labs with PCP.  Follow up annually and prn.   After visit summary provided.

## 2019-09-18 DIAGNOSIS — M9905 Segmental and somatic dysfunction of pelvic region: Secondary | ICD-10-CM | POA: Diagnosis not present

## 2019-09-18 DIAGNOSIS — R69 Illness, unspecified: Secondary | ICD-10-CM | POA: Diagnosis not present

## 2019-09-18 DIAGNOSIS — M5136 Other intervertebral disc degeneration, lumbar region: Secondary | ICD-10-CM | POA: Diagnosis not present

## 2019-09-18 DIAGNOSIS — M9904 Segmental and somatic dysfunction of sacral region: Secondary | ICD-10-CM | POA: Diagnosis not present

## 2019-09-18 DIAGNOSIS — M9903 Segmental and somatic dysfunction of lumbar region: Secondary | ICD-10-CM | POA: Diagnosis not present

## 2019-09-26 ENCOUNTER — Ambulatory Visit (INDEPENDENT_AMBULATORY_CARE_PROVIDER_SITE_OTHER): Payer: Medicare HMO | Admitting: Obstetrics and Gynecology

## 2019-09-26 ENCOUNTER — Encounter: Payer: Self-pay | Admitting: Obstetrics and Gynecology

## 2019-09-26 ENCOUNTER — Telehealth: Payer: Self-pay | Admitting: Obstetrics and Gynecology

## 2019-09-26 VITALS — BP 112/60 | HR 76 | Temp 98.6°F | Ht 65.0 in | Wt 158.2 lb

## 2019-09-26 DIAGNOSIS — N644 Mastodynia: Secondary | ICD-10-CM | POA: Diagnosis not present

## 2019-09-26 NOTE — Telephone Encounter (Signed)
Spoke with patient. Patient reports left breast tenderness that started on 09/25/19. Denies breast lumps, skin changes, nipple d/c, fever/chills. Patient is scheduled for Dx breast imaging at [facility] on 10/03/19, requesting an order. Patient received Covid19 vaccine on 09/25/19, states symptoms were present prior to receiving vaccine. Advised patient OV needed for further evaluation, patient agreeable.  OV scheduled for today at 4:30pm. Covid19 prescreen negative, precautions reviewed.   Routing to provider for final review. Patient is agreeable to disposition. Will close encounter.

## 2019-09-26 NOTE — Progress Notes (Signed)
GYNECOLOGY  VISIT   HPI: 64 y.o.   Married  Caucasian  female   [id number] with No LMP recorded. Patient has had a hysterectomy.   here for left breast tenderness onset yesterday am.  States her pain is improved.  Has not used any medication other than Tylenol last hs.   She received her Covid vaccine in her right arm yesterday.  Her prior Covid vaccine was about 3 weeks ago in the left arm.   Cleaning out the attic.   She had her bilateral mammogram in February, 2021.  [facility] goes to [facility].  She has an appointment for next Tuesday to do dx mammogram and [place].  GYNECOLOGIC HISTORY: No LMP recorded. Patient has had a hysterectomy. Contraception: Hyst--ovaries remain Menopausal hormone therapy:  none Last mammogram:  03-09-2019 Right breast U/S BIRADS 3 probably benign.  Last pap smear:   07-18-2018 negative, HR HPV negative,  07-14-17 negative         OB History    Gravida  3   Para  2   Term  2   Preterm      AB  1   Living  2     SAB      TAB      Ectopic      Multiple      Live Births                 Patient Active Problem List   Diagnosis Date Noted  . Pain in right knee 06/08/2019  . Liver hemangioma 07/16/2017    Past Medical History:  Diagnosis Date  . Abnormal Pap smear of cervix 1986   --hx cryotherapy to cervix for CIN II and CIN III  . Allergy   . Arthritis   . Colon polyps    adenomatous  . Diverticulosis     Past Surgical History:  Procedure Laterality Date  . ABDOMINAL HYSTERECTOMY  1995   TVH--ovaries remain  . AUGMENTATION MAMMAPLASTY  99991111   silicone  . COLONOSCOPY    . CYSTOSCOPY  03/08/2012   Procedure: CYSTOSCOPY;  Surgeon: Reece Packer, MD;  Location: [facility];  Service: Urology;  Laterality: N/A;  . RECTOCELE REPAIR  03/08/2012   Procedure: POSTERIOR REPAIR (RECTOCELE);  Surgeon: Reece Packer, MD;  Location: [facility];  Service: Urology;  Laterality: N/A;  . VAGINAL PROLAPSE REPAIR  03/08/2012   Procedure: VAGINAL VAULT  SUSPENSION;  Surgeon: Reece Packer, MD;  Location: [facility];  Service: Urology;  Laterality: N/A;  with Graft    Current Outpatient Medications  Medication Sig Dispense Refill  . aspirin 81 MG chewable tablet Chew by mouth daily.     No current facility-administered medications for this visit.     ALLERGIES: Codeine  Family History  Problem Relation Age of Onset  . Thyroid disease Mother        goiter  . Stroke Father   . Heart attack Father   . Hypertension Brother   . Heart disease Maternal Grandfather   . Colon cancer Neg Hx   . Stomach cancer Neg Hx   . Esophageal cancer Neg Hx   . Rectal cancer Neg Hx     Social History   Socioeconomic History  . Marital status: Married    Spouse name: Not on file  . Number of children: Not on file  . Years of education: Not on file  . Highest education level: Not on file  Occupational History  .  Not on file  Tobacco Use  . Smoking status: Never Smoker  . Smokeless tobacco: Never Used  Substance and Sexual Activity  . Alcohol use: Yes    Alcohol/week: 1.0 standard drinks    Types: 1 Standard drinks or equivalent per week    Comment: OCCASIONAL-wine  . Drug use: No  . Sexual activity: Not Currently    Partners: Male    Comment: TVH--still has ovaries, husband with prostate cancer  Other Topics Concern  . Not on file  Social History Narrative  . Not on file   Social Determinants of Health   Financial Resource Strain:   . Difficulty of Paying Living Expenses:   Food Insecurity:   . Worried About Charity fundraiser in the Last Year:   . Arboriculturist in the Last Year:   Transportation Needs:   . Film/video editor (Medical):   Marland Kitchen Lack of Transportation (Non-Medical):   Physical Activity:   . Days of Exercise per Week:   . Minutes of Exercise per Session:   Stress:   . Feeling of Stress :   Social Connections:   . Frequency of Communication with Friends and Family:   . Frequency of Social Gatherings with  Friends and Family:   . Attends Religious Services:   . Active Member of Clubs or Organizations:   . Attends Archivist Meetings:   Marland Kitchen Marital Status:   Intimate Partner Violence:   . Fear of Current or Ex-Partner:   . Emotionally Abused:   Marland Kitchen Physically Abused:   . Sexually Abused:     Review of Systems  All other systems reviewed and are negative.   PHYSICAL EXAMINATION:    BP 112/60   Pulse 76   Temp 98.6 F (37 C) (Temporal)   Ht 5\' 5"  (1.651 m)   Wt 158 lb 3.2 oz (71.8 kg)   BMI 26.33 kg/m     General appearance: alert, cooperative and appears stated age   Breasts:bilateral implants, no masses or tenderness, No nipple retraction or dimpling, No nipple discharge or bleeding, No axillary or supraclavicular adenopathy  Left breast lateral breast tenderness and fibroglandular texture.    Chaperone was present for exam.  ASSESSMENT  Left breast pain.  Status post bilateral breast implants.  PLAN  Proceed with dx left mammogram and left breast US at [facility].    An After Visit Summary was printed and given to the patient.  __15____ minutes face to face time of which over 50% was spent in counseling.

## 2019-09-26 NOTE — Telephone Encounter (Signed)
Patient is scheduled with [facility] on Tuesday for left breast tenderness. Will need order fax.

## 2019-09-26 NOTE — Telephone Encounter (Signed)
Please confirm dx left mammogram and possible left breast US for patient at [facility] next week.  I have seen the patient and she has left lateral breast pain and fibroglandular texture noted. She has bilateral breast implants.

## 2019-09-27 NOTE — Telephone Encounter (Signed)
Spoke with [place] at [facility]. Confirmed patient is scheduled for left breast Dx MMG and [place], if needed, on 10/03/19.   Order to Dr. Quincy Simmonds for signature and fax.   Patient placed in MMG hold.   Routing to Dr. Quincy Simmonds.   Encounter closed.

## 2019-10-03 DIAGNOSIS — R922 Inconclusive mammogram: Secondary | ICD-10-CM | POA: Diagnosis not present

## 2019-11-16 DIAGNOSIS — Z7982 Long term (current) use of aspirin: Secondary | ICD-10-CM | POA: Diagnosis not present

## 2019-11-16 DIAGNOSIS — R030 Elevated blood-pressure reading, without diagnosis of hypertension: Secondary | ICD-10-CM | POA: Diagnosis not present

## 2019-11-16 DIAGNOSIS — Z85828 Personal history of other malignant neoplasm of skin: Secondary | ICD-10-CM | POA: Diagnosis not present

## 2019-11-16 DIAGNOSIS — G3184 Mild cognitive impairment, so stated: Secondary | ICD-10-CM | POA: Diagnosis not present

## 2019-11-28 DIAGNOSIS — H43813 Vitreous degeneration, bilateral: Secondary | ICD-10-CM | POA: Diagnosis not present

## 2020-01-04 DIAGNOSIS — M5136 Other intervertebral disc degeneration, lumbar region: Secondary | ICD-10-CM | POA: Diagnosis not present

## 2020-01-04 DIAGNOSIS — M50322 Other cervical disc degeneration at C5-C6 level: Secondary | ICD-10-CM | POA: Diagnosis not present

## 2020-01-04 DIAGNOSIS — M9903 Segmental and somatic dysfunction of lumbar region: Secondary | ICD-10-CM | POA: Diagnosis not present

## 2020-01-04 DIAGNOSIS — M9901 Segmental and somatic dysfunction of cervical region: Secondary | ICD-10-CM | POA: Diagnosis not present

## 2020-01-17 DIAGNOSIS — M50322 Other cervical disc degeneration at C5-C6 level: Secondary | ICD-10-CM | POA: Diagnosis not present

## 2020-01-17 DIAGNOSIS — M9903 Segmental and somatic dysfunction of lumbar region: Secondary | ICD-10-CM | POA: Diagnosis not present

## 2020-01-17 DIAGNOSIS — M5136 Other intervertebral disc degeneration, lumbar region: Secondary | ICD-10-CM | POA: Diagnosis not present

## 2020-01-17 DIAGNOSIS — M9901 Segmental and somatic dysfunction of cervical region: Secondary | ICD-10-CM | POA: Diagnosis not present

## 2020-01-22 DIAGNOSIS — M5136 Other intervertebral disc degeneration, lumbar region: Secondary | ICD-10-CM | POA: Diagnosis not present

## 2020-01-22 DIAGNOSIS — M50322 Other cervical disc degeneration at C5-C6 level: Secondary | ICD-10-CM | POA: Diagnosis not present

## 2020-01-22 DIAGNOSIS — M9901 Segmental and somatic dysfunction of cervical region: Secondary | ICD-10-CM | POA: Diagnosis not present

## 2020-01-22 DIAGNOSIS — M9903 Segmental and somatic dysfunction of lumbar region: Secondary | ICD-10-CM | POA: Diagnosis not present

## 2020-01-23 ENCOUNTER — Encounter: Payer: Self-pay | Admitting: Obstetrics and Gynecology

## 2020-01-24 DIAGNOSIS — M5136 Other intervertebral disc degeneration, lumbar region: Secondary | ICD-10-CM | POA: Diagnosis not present

## 2020-01-24 DIAGNOSIS — M50322 Other cervical disc degeneration at C5-C6 level: Secondary | ICD-10-CM | POA: Diagnosis not present

## 2020-01-24 DIAGNOSIS — M9903 Segmental and somatic dysfunction of lumbar region: Secondary | ICD-10-CM | POA: Diagnosis not present

## 2020-01-24 DIAGNOSIS — M9901 Segmental and somatic dysfunction of cervical region: Secondary | ICD-10-CM | POA: Diagnosis not present

## 2020-01-29 DIAGNOSIS — M9903 Segmental and somatic dysfunction of lumbar region: Secondary | ICD-10-CM | POA: Diagnosis not present

## 2020-01-29 DIAGNOSIS — M50322 Other cervical disc degeneration at C5-C6 level: Secondary | ICD-10-CM | POA: Diagnosis not present

## 2020-01-29 DIAGNOSIS — M5136 Other intervertebral disc degeneration, lumbar region: Secondary | ICD-10-CM | POA: Diagnosis not present

## 2020-01-29 DIAGNOSIS — M9901 Segmental and somatic dysfunction of cervical region: Secondary | ICD-10-CM | POA: Diagnosis not present

## 2020-01-31 DIAGNOSIS — M50322 Other cervical disc degeneration at C5-C6 level: Secondary | ICD-10-CM | POA: Diagnosis not present

## 2020-01-31 DIAGNOSIS — M9901 Segmental and somatic dysfunction of cervical region: Secondary | ICD-10-CM | POA: Diagnosis not present

## 2020-01-31 DIAGNOSIS — M5136 Other intervertebral disc degeneration, lumbar region: Secondary | ICD-10-CM | POA: Diagnosis not present

## 2020-01-31 DIAGNOSIS — M9903 Segmental and somatic dysfunction of lumbar region: Secondary | ICD-10-CM | POA: Diagnosis not present

## 2020-02-07 DIAGNOSIS — M9901 Segmental and somatic dysfunction of cervical region: Secondary | ICD-10-CM | POA: Diagnosis not present

## 2020-02-07 DIAGNOSIS — M5136 Other intervertebral disc degeneration, lumbar region: Secondary | ICD-10-CM | POA: Diagnosis not present

## 2020-02-07 DIAGNOSIS — M50322 Other cervical disc degeneration at C5-C6 level: Secondary | ICD-10-CM | POA: Diagnosis not present

## 2020-02-07 DIAGNOSIS — M9903 Segmental and somatic dysfunction of lumbar region: Secondary | ICD-10-CM | POA: Diagnosis not present

## 2020-02-21 DIAGNOSIS — M9901 Segmental and somatic dysfunction of cervical region: Secondary | ICD-10-CM | POA: Diagnosis not present

## 2020-02-21 DIAGNOSIS — M9903 Segmental and somatic dysfunction of lumbar region: Secondary | ICD-10-CM | POA: Diagnosis not present

## 2020-02-21 DIAGNOSIS — M5136 Other intervertebral disc degeneration, lumbar region: Secondary | ICD-10-CM | POA: Diagnosis not present

## 2020-02-21 DIAGNOSIS — M50322 Other cervical disc degeneration at C5-C6 level: Secondary | ICD-10-CM | POA: Diagnosis not present

## 2020-02-29 DIAGNOSIS — M9903 Segmental and somatic dysfunction of lumbar region: Secondary | ICD-10-CM | POA: Diagnosis not present

## 2020-02-29 DIAGNOSIS — M5136 Other intervertebral disc degeneration, lumbar region: Secondary | ICD-10-CM | POA: Diagnosis not present

## 2020-02-29 DIAGNOSIS — M9901 Segmental and somatic dysfunction of cervical region: Secondary | ICD-10-CM | POA: Diagnosis not present

## 2020-02-29 DIAGNOSIS — M50322 Other cervical disc degeneration at C5-C6 level: Secondary | ICD-10-CM | POA: Diagnosis not present

## 2020-03-04 DIAGNOSIS — M9903 Segmental and somatic dysfunction of lumbar region: Secondary | ICD-10-CM | POA: Diagnosis not present

## 2020-03-04 DIAGNOSIS — E785 Hyperlipidemia, unspecified: Secondary | ICD-10-CM | POA: Diagnosis not present

## 2020-03-04 DIAGNOSIS — M5136 Other intervertebral disc degeneration, lumbar region: Secondary | ICD-10-CM | POA: Diagnosis not present

## 2020-03-04 DIAGNOSIS — M50322 Other cervical disc degeneration at C5-C6 level: Secondary | ICD-10-CM | POA: Diagnosis not present

## 2020-03-04 DIAGNOSIS — M9901 Segmental and somatic dysfunction of cervical region: Secondary | ICD-10-CM | POA: Diagnosis not present

## 2020-03-11 DIAGNOSIS — K589 Irritable bowel syndrome without diarrhea: Secondary | ICD-10-CM | POA: Diagnosis not present

## 2020-03-11 DIAGNOSIS — Z23 Encounter for immunization: Secondary | ICD-10-CM | POA: Diagnosis not present

## 2020-03-11 DIAGNOSIS — R82998 Other abnormal findings in urine: Secondary | ICD-10-CM | POA: Diagnosis not present

## 2020-03-11 DIAGNOSIS — Z0000 Encounter for general adult medical examination without abnormal findings: Secondary | ICD-10-CM | POA: Diagnosis not present

## 2020-03-11 DIAGNOSIS — E785 Hyperlipidemia, unspecified: Secondary | ICD-10-CM | POA: Diagnosis not present

## 2020-03-11 DIAGNOSIS — Z20822 Contact with and (suspected) exposure to covid-19: Secondary | ICD-10-CM | POA: Diagnosis not present

## 2020-03-20 DIAGNOSIS — Z1212 Encounter for screening for malignant neoplasm of rectum: Secondary | ICD-10-CM | POA: Diagnosis not present

## 2020-03-20 DIAGNOSIS — M9903 Segmental and somatic dysfunction of lumbar region: Secondary | ICD-10-CM | POA: Diagnosis not present

## 2020-03-20 DIAGNOSIS — M50322 Other cervical disc degeneration at C5-C6 level: Secondary | ICD-10-CM | POA: Diagnosis not present

## 2020-03-20 DIAGNOSIS — M5136 Other intervertebral disc degeneration, lumbar region: Secondary | ICD-10-CM | POA: Diagnosis not present

## 2020-03-20 DIAGNOSIS — M9901 Segmental and somatic dysfunction of cervical region: Secondary | ICD-10-CM | POA: Diagnosis not present

## 2020-03-21 DIAGNOSIS — R69 Illness, unspecified: Secondary | ICD-10-CM | POA: Diagnosis not present

## 2020-04-03 DIAGNOSIS — L578 Other skin changes due to chronic exposure to nonionizing radiation: Secondary | ICD-10-CM | POA: Diagnosis not present

## 2020-04-03 DIAGNOSIS — D2272 Melanocytic nevi of left lower limb, including hip: Secondary | ICD-10-CM | POA: Diagnosis not present

## 2020-04-03 DIAGNOSIS — D485 Neoplasm of uncertain behavior of skin: Secondary | ICD-10-CM | POA: Diagnosis not present

## 2020-04-03 DIAGNOSIS — Z86018 Personal history of other benign neoplasm: Secondary | ICD-10-CM | POA: Diagnosis not present

## 2020-04-03 DIAGNOSIS — Z85828 Personal history of other malignant neoplasm of skin: Secondary | ICD-10-CM | POA: Diagnosis not present

## 2020-04-03 DIAGNOSIS — L821 Other seborrheic keratosis: Secondary | ICD-10-CM | POA: Diagnosis not present

## 2020-04-03 DIAGNOSIS — D225 Melanocytic nevi of trunk: Secondary | ICD-10-CM | POA: Diagnosis not present

## 2020-04-03 DIAGNOSIS — D2261 Melanocytic nevi of right upper limb, including shoulder: Secondary | ICD-10-CM | POA: Diagnosis not present

## 2020-04-03 DIAGNOSIS — L570 Actinic keratosis: Secondary | ICD-10-CM | POA: Diagnosis not present

## 2020-04-04 DIAGNOSIS — M9903 Segmental and somatic dysfunction of lumbar region: Secondary | ICD-10-CM | POA: Diagnosis not present

## 2020-04-04 DIAGNOSIS — M9901 Segmental and somatic dysfunction of cervical region: Secondary | ICD-10-CM | POA: Diagnosis not present

## 2020-04-04 DIAGNOSIS — M50322 Other cervical disc degeneration at C5-C6 level: Secondary | ICD-10-CM | POA: Diagnosis not present

## 2020-04-04 DIAGNOSIS — M5136 Other intervertebral disc degeneration, lumbar region: Secondary | ICD-10-CM | POA: Diagnosis not present

## 2020-04-08 DIAGNOSIS — M9903 Segmental and somatic dysfunction of lumbar region: Secondary | ICD-10-CM | POA: Diagnosis not present

## 2020-04-08 DIAGNOSIS — M50322 Other cervical disc degeneration at C5-C6 level: Secondary | ICD-10-CM | POA: Diagnosis not present

## 2020-04-08 DIAGNOSIS — M5136 Other intervertebral disc degeneration, lumbar region: Secondary | ICD-10-CM | POA: Diagnosis not present

## 2020-04-08 DIAGNOSIS — M9901 Segmental and somatic dysfunction of cervical region: Secondary | ICD-10-CM | POA: Diagnosis not present

## 2020-04-18 DIAGNOSIS — M9901 Segmental and somatic dysfunction of cervical region: Secondary | ICD-10-CM | POA: Diagnosis not present

## 2020-04-18 DIAGNOSIS — M50322 Other cervical disc degeneration at C5-C6 level: Secondary | ICD-10-CM | POA: Diagnosis not present

## 2020-04-18 DIAGNOSIS — M9903 Segmental and somatic dysfunction of lumbar region: Secondary | ICD-10-CM | POA: Diagnosis not present

## 2020-04-18 DIAGNOSIS — M5136 Other intervertebral disc degeneration, lumbar region: Secondary | ICD-10-CM | POA: Diagnosis not present

## 2020-04-29 DIAGNOSIS — M9903 Segmental and somatic dysfunction of lumbar region: Secondary | ICD-10-CM | POA: Diagnosis not present

## 2020-04-29 DIAGNOSIS — M5136 Other intervertebral disc degeneration, lumbar region: Secondary | ICD-10-CM | POA: Diagnosis not present

## 2020-04-29 DIAGNOSIS — M9901 Segmental and somatic dysfunction of cervical region: Secondary | ICD-10-CM | POA: Diagnosis not present

## 2020-04-29 DIAGNOSIS — M50322 Other cervical disc degeneration at C5-C6 level: Secondary | ICD-10-CM | POA: Diagnosis not present

## 2020-05-07 DIAGNOSIS — M50322 Other cervical disc degeneration at C5-C6 level: Secondary | ICD-10-CM | POA: Diagnosis not present

## 2020-05-07 DIAGNOSIS — M9903 Segmental and somatic dysfunction of lumbar region: Secondary | ICD-10-CM | POA: Diagnosis not present

## 2020-05-07 DIAGNOSIS — M5136 Other intervertebral disc degeneration, lumbar region: Secondary | ICD-10-CM | POA: Diagnosis not present

## 2020-05-07 DIAGNOSIS — M9901 Segmental and somatic dysfunction of cervical region: Secondary | ICD-10-CM | POA: Diagnosis not present

## 2020-05-09 DIAGNOSIS — M5136 Other intervertebral disc degeneration, lumbar region: Secondary | ICD-10-CM | POA: Diagnosis not present

## 2020-05-09 DIAGNOSIS — M50322 Other cervical disc degeneration at C5-C6 level: Secondary | ICD-10-CM | POA: Diagnosis not present

## 2020-05-09 DIAGNOSIS — M9903 Segmental and somatic dysfunction of lumbar region: Secondary | ICD-10-CM | POA: Diagnosis not present

## 2020-05-09 DIAGNOSIS — M9901 Segmental and somatic dysfunction of cervical region: Secondary | ICD-10-CM | POA: Diagnosis not present

## 2020-05-13 DIAGNOSIS — M5136 Other intervertebral disc degeneration, lumbar region: Secondary | ICD-10-CM | POA: Diagnosis not present

## 2020-05-13 DIAGNOSIS — M9903 Segmental and somatic dysfunction of lumbar region: Secondary | ICD-10-CM | POA: Diagnosis not present

## 2020-05-13 DIAGNOSIS — M50322 Other cervical disc degeneration at C5-C6 level: Secondary | ICD-10-CM | POA: Diagnosis not present

## 2020-05-13 DIAGNOSIS — M9901 Segmental and somatic dysfunction of cervical region: Secondary | ICD-10-CM | POA: Diagnosis not present

## 2020-05-20 DIAGNOSIS — M9901 Segmental and somatic dysfunction of cervical region: Secondary | ICD-10-CM | POA: Diagnosis not present

## 2020-05-20 DIAGNOSIS — M50322 Other cervical disc degeneration at C5-C6 level: Secondary | ICD-10-CM | POA: Diagnosis not present

## 2020-05-20 DIAGNOSIS — M9903 Segmental and somatic dysfunction of lumbar region: Secondary | ICD-10-CM | POA: Diagnosis not present

## 2020-05-20 DIAGNOSIS — M5136 Other intervertebral disc degeneration, lumbar region: Secondary | ICD-10-CM | POA: Diagnosis not present

## 2020-05-27 DIAGNOSIS — M9901 Segmental and somatic dysfunction of cervical region: Secondary | ICD-10-CM | POA: Diagnosis not present

## 2020-05-27 DIAGNOSIS — M50322 Other cervical disc degeneration at C5-C6 level: Secondary | ICD-10-CM | POA: Diagnosis not present

## 2020-05-27 DIAGNOSIS — M5136 Other intervertebral disc degeneration, lumbar region: Secondary | ICD-10-CM | POA: Diagnosis not present

## 2020-05-27 DIAGNOSIS — M9903 Segmental and somatic dysfunction of lumbar region: Secondary | ICD-10-CM | POA: Diagnosis not present

## 2020-06-04 DIAGNOSIS — M9903 Segmental and somatic dysfunction of lumbar region: Secondary | ICD-10-CM | POA: Diagnosis not present

## 2020-06-04 DIAGNOSIS — M9901 Segmental and somatic dysfunction of cervical region: Secondary | ICD-10-CM | POA: Diagnosis not present

## 2020-06-04 DIAGNOSIS — M50322 Other cervical disc degeneration at C5-C6 level: Secondary | ICD-10-CM | POA: Diagnosis not present

## 2020-06-04 DIAGNOSIS — M5136 Other intervertebral disc degeneration, lumbar region: Secondary | ICD-10-CM | POA: Diagnosis not present

## 2020-06-24 DIAGNOSIS — M9901 Segmental and somatic dysfunction of cervical region: Secondary | ICD-10-CM | POA: Diagnosis not present

## 2020-06-24 DIAGNOSIS — M5136 Other intervertebral disc degeneration, lumbar region: Secondary | ICD-10-CM | POA: Diagnosis not present

## 2020-06-24 DIAGNOSIS — M50322 Other cervical disc degeneration at C5-C6 level: Secondary | ICD-10-CM | POA: Diagnosis not present

## 2020-06-24 DIAGNOSIS — M9903 Segmental and somatic dysfunction of lumbar region: Secondary | ICD-10-CM | POA: Diagnosis not present

## 2020-07-15 DIAGNOSIS — M9901 Segmental and somatic dysfunction of cervical region: Secondary | ICD-10-CM | POA: Diagnosis not present

## 2020-07-15 DIAGNOSIS — M50322 Other cervical disc degeneration at C5-C6 level: Secondary | ICD-10-CM | POA: Diagnosis not present

## 2020-07-15 DIAGNOSIS — M9903 Segmental and somatic dysfunction of lumbar region: Secondary | ICD-10-CM | POA: Diagnosis not present

## 2020-07-15 DIAGNOSIS — M5136 Other intervertebral disc degeneration, lumbar region: Secondary | ICD-10-CM | POA: Diagnosis not present

## 2020-07-16 DIAGNOSIS — M5136 Other intervertebral disc degeneration, lumbar region: Secondary | ICD-10-CM | POA: Diagnosis not present

## 2020-07-16 DIAGNOSIS — M50322 Other cervical disc degeneration at C5-C6 level: Secondary | ICD-10-CM | POA: Diagnosis not present

## 2020-07-16 DIAGNOSIS — M9903 Segmental and somatic dysfunction of lumbar region: Secondary | ICD-10-CM | POA: Diagnosis not present

## 2020-07-16 DIAGNOSIS — M9901 Segmental and somatic dysfunction of cervical region: Secondary | ICD-10-CM | POA: Diagnosis not present

## 2020-07-25 DIAGNOSIS — M9903 Segmental and somatic dysfunction of lumbar region: Secondary | ICD-10-CM | POA: Diagnosis not present

## 2020-07-25 DIAGNOSIS — M9901 Segmental and somatic dysfunction of cervical region: Secondary | ICD-10-CM | POA: Diagnosis not present

## 2020-07-25 DIAGNOSIS — M5136 Other intervertebral disc degeneration, lumbar region: Secondary | ICD-10-CM | POA: Diagnosis not present

## 2020-07-25 DIAGNOSIS — M50322 Other cervical disc degeneration at C5-C6 level: Secondary | ICD-10-CM | POA: Diagnosis not present

## 2020-07-31 DIAGNOSIS — M9903 Segmental and somatic dysfunction of lumbar region: Secondary | ICD-10-CM | POA: Diagnosis not present

## 2020-07-31 DIAGNOSIS — M5136 Other intervertebral disc degeneration, lumbar region: Secondary | ICD-10-CM | POA: Diagnosis not present

## 2020-07-31 DIAGNOSIS — M9902 Segmental and somatic dysfunction of thoracic region: Secondary | ICD-10-CM | POA: Diagnosis not present

## 2020-07-31 DIAGNOSIS — M5134 Other intervertebral disc degeneration, thoracic region: Secondary | ICD-10-CM | POA: Diagnosis not present

## 2020-08-06 DIAGNOSIS — Z1231 Encounter for screening mammogram for malignant neoplasm of breast: Secondary | ICD-10-CM | POA: Diagnosis not present

## 2020-08-13 DIAGNOSIS — M5136 Other intervertebral disc degeneration, lumbar region: Secondary | ICD-10-CM | POA: Diagnosis not present

## 2020-08-13 DIAGNOSIS — M9903 Segmental and somatic dysfunction of lumbar region: Secondary | ICD-10-CM | POA: Diagnosis not present

## 2020-08-13 DIAGNOSIS — M5134 Other intervertebral disc degeneration, thoracic region: Secondary | ICD-10-CM | POA: Diagnosis not present

## 2020-08-13 DIAGNOSIS — M9902 Segmental and somatic dysfunction of thoracic region: Secondary | ICD-10-CM | POA: Diagnosis not present

## 2020-08-19 ENCOUNTER — Ambulatory Visit: Payer: BC Managed Care – PPO | Admitting: Obstetrics and Gynecology

## 2020-08-20 DIAGNOSIS — M9902 Segmental and somatic dysfunction of thoracic region: Secondary | ICD-10-CM | POA: Diagnosis not present

## 2020-08-20 DIAGNOSIS — M5136 Other intervertebral disc degeneration, lumbar region: Secondary | ICD-10-CM | POA: Diagnosis not present

## 2020-08-20 DIAGNOSIS — M9903 Segmental and somatic dysfunction of lumbar region: Secondary | ICD-10-CM | POA: Diagnosis not present

## 2020-08-20 DIAGNOSIS — M5134 Other intervertebral disc degeneration, thoracic region: Secondary | ICD-10-CM | POA: Diagnosis not present

## 2020-08-22 DIAGNOSIS — M5136 Other intervertebral disc degeneration, lumbar region: Secondary | ICD-10-CM | POA: Diagnosis not present

## 2020-08-22 DIAGNOSIS — M9902 Segmental and somatic dysfunction of thoracic region: Secondary | ICD-10-CM | POA: Diagnosis not present

## 2020-08-22 DIAGNOSIS — M9903 Segmental and somatic dysfunction of lumbar region: Secondary | ICD-10-CM | POA: Diagnosis not present

## 2020-08-22 DIAGNOSIS — M5134 Other intervertebral disc degeneration, thoracic region: Secondary | ICD-10-CM | POA: Diagnosis not present

## 2020-08-26 DIAGNOSIS — M9903 Segmental and somatic dysfunction of lumbar region: Secondary | ICD-10-CM | POA: Diagnosis not present

## 2020-08-26 DIAGNOSIS — M5134 Other intervertebral disc degeneration, thoracic region: Secondary | ICD-10-CM | POA: Diagnosis not present

## 2020-08-26 DIAGNOSIS — M9902 Segmental and somatic dysfunction of thoracic region: Secondary | ICD-10-CM | POA: Diagnosis not present

## 2020-08-26 DIAGNOSIS — M5136 Other intervertebral disc degeneration, lumbar region: Secondary | ICD-10-CM | POA: Diagnosis not present

## 2020-09-02 DIAGNOSIS — M9903 Segmental and somatic dysfunction of lumbar region: Secondary | ICD-10-CM | POA: Diagnosis not present

## 2020-09-02 DIAGNOSIS — M5136 Other intervertebral disc degeneration, lumbar region: Secondary | ICD-10-CM | POA: Diagnosis not present

## 2020-09-02 DIAGNOSIS — M9902 Segmental and somatic dysfunction of thoracic region: Secondary | ICD-10-CM | POA: Diagnosis not present

## 2020-09-02 DIAGNOSIS — M5134 Other intervertebral disc degeneration, thoracic region: Secondary | ICD-10-CM | POA: Diagnosis not present

## 2020-09-09 DIAGNOSIS — M9902 Segmental and somatic dysfunction of thoracic region: Secondary | ICD-10-CM | POA: Diagnosis not present

## 2020-09-09 DIAGNOSIS — M5134 Other intervertebral disc degeneration, thoracic region: Secondary | ICD-10-CM | POA: Diagnosis not present

## 2020-09-09 DIAGNOSIS — M5136 Other intervertebral disc degeneration, lumbar region: Secondary | ICD-10-CM | POA: Diagnosis not present

## 2020-09-09 DIAGNOSIS — M9903 Segmental and somatic dysfunction of lumbar region: Secondary | ICD-10-CM | POA: Diagnosis not present

## 2020-09-10 DIAGNOSIS — M9903 Segmental and somatic dysfunction of lumbar region: Secondary | ICD-10-CM | POA: Diagnosis not present

## 2020-09-10 DIAGNOSIS — M5136 Other intervertebral disc degeneration, lumbar region: Secondary | ICD-10-CM | POA: Diagnosis not present

## 2020-09-10 DIAGNOSIS — M5134 Other intervertebral disc degeneration, thoracic region: Secondary | ICD-10-CM | POA: Diagnosis not present

## 2020-09-10 DIAGNOSIS — Z823 Family history of stroke: Secondary | ICD-10-CM | POA: Diagnosis not present

## 2020-09-10 DIAGNOSIS — Z8249 Family history of ischemic heart disease and other diseases of the circulatory system: Secondary | ICD-10-CM | POA: Diagnosis not present

## 2020-09-10 DIAGNOSIS — R030 Elevated blood-pressure reading, without diagnosis of hypertension: Secondary | ICD-10-CM | POA: Diagnosis not present

## 2020-09-10 DIAGNOSIS — Z85828 Personal history of other malignant neoplasm of skin: Secondary | ICD-10-CM | POA: Diagnosis not present

## 2020-09-10 DIAGNOSIS — M9902 Segmental and somatic dysfunction of thoracic region: Secondary | ICD-10-CM | POA: Diagnosis not present

## 2020-09-12 DIAGNOSIS — M5136 Other intervertebral disc degeneration, lumbar region: Secondary | ICD-10-CM | POA: Diagnosis not present

## 2020-09-12 DIAGNOSIS — M9903 Segmental and somatic dysfunction of lumbar region: Secondary | ICD-10-CM | POA: Diagnosis not present

## 2020-09-12 DIAGNOSIS — Z23 Encounter for immunization: Secondary | ICD-10-CM | POA: Diagnosis not present

## 2020-09-12 DIAGNOSIS — E785 Hyperlipidemia, unspecified: Secondary | ICD-10-CM | POA: Diagnosis not present

## 2020-09-12 DIAGNOSIS — M5134 Other intervertebral disc degeneration, thoracic region: Secondary | ICD-10-CM | POA: Diagnosis not present

## 2020-09-12 DIAGNOSIS — Z1331 Encounter for screening for depression: Secondary | ICD-10-CM | POA: Diagnosis not present

## 2020-09-12 DIAGNOSIS — K589 Irritable bowel syndrome without diarrhea: Secondary | ICD-10-CM | POA: Diagnosis not present

## 2020-09-12 DIAGNOSIS — M9902 Segmental and somatic dysfunction of thoracic region: Secondary | ICD-10-CM | POA: Diagnosis not present

## 2020-09-12 DIAGNOSIS — Z1339 Encounter for screening examination for other mental health and behavioral disorders: Secondary | ICD-10-CM | POA: Diagnosis not present

## 2020-09-16 DIAGNOSIS — M9902 Segmental and somatic dysfunction of thoracic region: Secondary | ICD-10-CM | POA: Diagnosis not present

## 2020-09-16 DIAGNOSIS — M9903 Segmental and somatic dysfunction of lumbar region: Secondary | ICD-10-CM | POA: Diagnosis not present

## 2020-09-16 DIAGNOSIS — M5134 Other intervertebral disc degeneration, thoracic region: Secondary | ICD-10-CM | POA: Diagnosis not present

## 2020-09-16 DIAGNOSIS — M5136 Other intervertebral disc degeneration, lumbar region: Secondary | ICD-10-CM | POA: Diagnosis not present

## 2020-09-17 DIAGNOSIS — L578 Other skin changes due to chronic exposure to nonionizing radiation: Secondary | ICD-10-CM | POA: Diagnosis not present

## 2020-09-17 DIAGNOSIS — D485 Neoplasm of uncertain behavior of skin: Secondary | ICD-10-CM | POA: Diagnosis not present

## 2020-09-17 DIAGNOSIS — L570 Actinic keratosis: Secondary | ICD-10-CM | POA: Diagnosis not present

## 2020-09-19 DIAGNOSIS — M5136 Other intervertebral disc degeneration, lumbar region: Secondary | ICD-10-CM | POA: Diagnosis not present

## 2020-09-19 DIAGNOSIS — M5134 Other intervertebral disc degeneration, thoracic region: Secondary | ICD-10-CM | POA: Diagnosis not present

## 2020-09-19 DIAGNOSIS — M9902 Segmental and somatic dysfunction of thoracic region: Secondary | ICD-10-CM | POA: Diagnosis not present

## 2020-09-19 DIAGNOSIS — M9903 Segmental and somatic dysfunction of lumbar region: Secondary | ICD-10-CM | POA: Diagnosis not present

## 2020-09-24 DIAGNOSIS — M5136 Other intervertebral disc degeneration, lumbar region: Secondary | ICD-10-CM | POA: Diagnosis not present

## 2020-09-24 DIAGNOSIS — M9902 Segmental and somatic dysfunction of thoracic region: Secondary | ICD-10-CM | POA: Diagnosis not present

## 2020-09-24 DIAGNOSIS — M5134 Other intervertebral disc degeneration, thoracic region: Secondary | ICD-10-CM | POA: Diagnosis not present

## 2020-09-24 DIAGNOSIS — M9903 Segmental and somatic dysfunction of lumbar region: Secondary | ICD-10-CM | POA: Diagnosis not present

## 2020-09-25 DIAGNOSIS — M9903 Segmental and somatic dysfunction of lumbar region: Secondary | ICD-10-CM | POA: Diagnosis not present

## 2020-09-25 DIAGNOSIS — M5136 Other intervertebral disc degeneration, lumbar region: Secondary | ICD-10-CM | POA: Diagnosis not present

## 2020-09-25 DIAGNOSIS — M5134 Other intervertebral disc degeneration, thoracic region: Secondary | ICD-10-CM | POA: Diagnosis not present

## 2020-09-25 DIAGNOSIS — M9902 Segmental and somatic dysfunction of thoracic region: Secondary | ICD-10-CM | POA: Diagnosis not present

## 2020-09-30 DIAGNOSIS — M50322 Other cervical disc degeneration at C5-C6 level: Secondary | ICD-10-CM | POA: Diagnosis not present

## 2020-09-30 DIAGNOSIS — M5134 Other intervertebral disc degeneration, thoracic region: Secondary | ICD-10-CM | POA: Diagnosis not present

## 2020-09-30 DIAGNOSIS — M9901 Segmental and somatic dysfunction of cervical region: Secondary | ICD-10-CM | POA: Diagnosis not present

## 2020-09-30 DIAGNOSIS — M9902 Segmental and somatic dysfunction of thoracic region: Secondary | ICD-10-CM | POA: Diagnosis not present

## 2020-10-03 DIAGNOSIS — M9901 Segmental and somatic dysfunction of cervical region: Secondary | ICD-10-CM | POA: Diagnosis not present

## 2020-10-03 DIAGNOSIS — M9902 Segmental and somatic dysfunction of thoracic region: Secondary | ICD-10-CM | POA: Diagnosis not present

## 2020-10-03 DIAGNOSIS — M5134 Other intervertebral disc degeneration, thoracic region: Secondary | ICD-10-CM | POA: Diagnosis not present

## 2020-10-03 DIAGNOSIS — M50322 Other cervical disc degeneration at C5-C6 level: Secondary | ICD-10-CM | POA: Diagnosis not present

## 2020-10-08 NOTE — Progress Notes (Signed)
65 y.o. G65P2012 Married Caucasian female here for breast and pelvic exam.    Saw dermatology and has a precancerous lesion in her scalp.   Bladder and bowel function is ok.   Received her second Covid vaccine.   PCP: Burnard Bunting, MD     No LMP recorded. Patient has had a hysterectomy.           Sexually active: Occ. The current method of family planning is status post hysterectomy for prolapse. Exercising: Yes.  but has no regular exercise apart from work.  walking  Smoker: no  Health Maintenance: Pap: 07-18-2018 negative, HR HPV negative            07-14-17 negative  History of abnormal Pap:  Yes, 1985 hx of cryotherapy to cervix for CIN II and CIN III.  Follow up paps normal. MMG:  09/2020 normal with [facility] per patient. Colonoscopy:  11-19-16 polyp- repeat 5 years.  [facility].   BMD:  05-2017   Result  Normal  TDaP: PCP HIV: No Hep C: No Screening Labs:  PCP   reports that she has never smoked. She has never used smokeless tobacco. She reports current alcohol use of about 1.0 standard drink of alcohol per week. She reports that she does not use drugs.  Past Medical History:  Diagnosis Date  . Abnormal Pap smear of cervix 1986   --hx cryotherapy to cervix for CIN II and CIN III  . Allergy   . Arthritis   . Colon polyps    adenomatous  . Diverticulosis     Past Surgical History:  Procedure Laterality Date  . ABDOMINAL HYSTERECTOMY  1995   TVH--ovaries remain  . AUGMENTATION MAMMAPLASTY  9381   silicone  . COLONOSCOPY    . CYSTOSCOPY  03/08/2012   Procedure: CYSTOSCOPY;  Surgeon: Reece Packer, MD;  Location: [facility];  Service: Urology;  Laterality: N/A;  . RECTOCELE REPAIR  03/08/2012   Procedure: POSTERIOR REPAIR (RECTOCELE);  Surgeon: Reece Packer, MD;  Location: [facility];  Service: Urology;  Laterality: N/A;  . VAGINAL PROLAPSE REPAIR  03/08/2012   Procedure: VAGINAL VAULT SUSPENSION;  Surgeon: Reece Packer, MD;  Location: [facility];  Service:  Urology;  Laterality: N/A;  with Graft    No current outpatient medications on file.   No current facility-administered medications for this visit.    Family History  Problem Relation Age of Onset  . Thyroid disease Mother        goiter  . Stroke Father   . Heart attack Father   . Hypertension Brother   . Heart disease Maternal Grandfather   . Colon cancer Neg Hx   . Stomach cancer Neg Hx   . Esophageal cancer Neg Hx   . Rectal cancer Neg Hx     Review of Systems  All other systems reviewed and are negative.   Exam:   BP 124/70   Pulse 70   Ht 5\' 5"  (1.651 m)   Wt 165 lb (74.8 kg)   SpO2 98%   BMI 27.46 kg/m     General appearance: alert, cooperative and appears stated age Head: normocephalic, without obvious abnormality, atraumatic Neck: no adenopathy, supple, symmetrical, trachea midline and thyroid normal to inspection and palpation Lungs: clear to auscultation bilaterally Breasts: bilateral implants, normal appearance, no dominant masses or tenderness, No nipple retraction or dimpling, No nipple discharge or bleeding, No axillary adenopathy Heart: regular rate and rhythm Abdomen: soft, non-tender; no masses, no  organomegaly Extremities: extremities normal, atraumatic, no cyanosis or edema Skin: skin color, texture, turgor normal. No rashes or lesions Lymph nodes: cervical, supraclavicular, and axillary nodes normal. Neurologic: grossly normal  Pelvic: External genitalia:  no lesions              No abnormal inguinal nodes palpated.              Urethra:  normal appearing urethra with no masses, tenderness or lesions              Bartholins and Skenes: normal                 Vagina: normal appearing vagina with normal color and discharge, atrophy noted.  Good support.              Cervix: absent              Pap taken: Yes.   Bimanual Exam:  Uterus:  absent              Adnexa: no mass, fullness, tenderness              Rectal exam: Yes.  .  Confirms.               Anus:  normal sphincter tone, no lesions  Chaperone was present for exam.  Assessment:    Pelvic exam with no abnormal findings.  Status post TVH. Hx CIN II/III. Ovaries remain. Status post rectocele repair with vaginal vault suspension. Screening breast exam.  Plan: Mammogram screening discussed. Self breast awareness reviewed. We reviewed pap guildelines and patient does want a pap today. Pap and reflex HR HPV testing done.  Guidelines for Calcium, Vitamin D, regular exercise program including cardiovascular and weight bearing exercise. Next BMD in 2028.    Follow up annually and prn.   20  total time was spent for this patient encounter, including preparation, face-to-face counseling with the patient, coordination of care, and documentation of the encounter.

## 2020-10-09 DIAGNOSIS — M9901 Segmental and somatic dysfunction of cervical region: Secondary | ICD-10-CM | POA: Diagnosis not present

## 2020-10-09 DIAGNOSIS — M9902 Segmental and somatic dysfunction of thoracic region: Secondary | ICD-10-CM | POA: Diagnosis not present

## 2020-10-09 DIAGNOSIS — M50322 Other cervical disc degeneration at C5-C6 level: Secondary | ICD-10-CM | POA: Diagnosis not present

## 2020-10-09 DIAGNOSIS — M5134 Other intervertebral disc degeneration, thoracic region: Secondary | ICD-10-CM | POA: Diagnosis not present

## 2020-10-10 ENCOUNTER — Encounter: Payer: Self-pay | Admitting: Obstetrics and Gynecology

## 2020-10-10 ENCOUNTER — Other Ambulatory Visit (HOSPITAL_COMMUNITY)
Admission: RE | Admit: 2020-10-10 | Discharge: 2020-10-10 | Disposition: A | Discharge status: Home / Self Care | Payer: Medicare HMO | Source: Ambulatory Visit | Attending: Obstetrics and Gynecology | Admitting: Obstetrics and Gynecology

## 2020-10-10 ENCOUNTER — Other Ambulatory Visit: Payer: Self-pay

## 2020-10-10 ENCOUNTER — Ambulatory Visit: Payer: Medicare HMO | Admitting: Obstetrics and Gynecology

## 2020-10-10 VITALS — BP 124/70 | HR 70 | Ht 65.0 in | Wt 165.0 lb

## 2020-10-10 DIAGNOSIS — Z01419 Encounter for gynecological examination (general) (routine) without abnormal findings: Secondary | ICD-10-CM | POA: Diagnosis not present

## 2020-10-10 DIAGNOSIS — Z124 Encounter for screening for malignant neoplasm of cervix: Secondary | ICD-10-CM

## 2020-10-10 DIAGNOSIS — Z1239 Encounter for other screening for malignant neoplasm of breast: Secondary | ICD-10-CM

## 2020-10-10 NOTE — Patient Instructions (Signed)

## 2020-10-11 LAB — CYTOLOGY - PAP: Diagnosis: NEGATIVE

## 2020-10-14 DIAGNOSIS — M5134 Other intervertebral disc degeneration, thoracic region: Secondary | ICD-10-CM | POA: Diagnosis not present

## 2020-10-14 DIAGNOSIS — M9901 Segmental and somatic dysfunction of cervical region: Secondary | ICD-10-CM | POA: Diagnosis not present

## 2020-10-14 DIAGNOSIS — M50322 Other cervical disc degeneration at C5-C6 level: Secondary | ICD-10-CM | POA: Diagnosis not present

## 2020-10-14 DIAGNOSIS — M9902 Segmental and somatic dysfunction of thoracic region: Secondary | ICD-10-CM | POA: Diagnosis not present

## 2020-10-16 DIAGNOSIS — M9901 Segmental and somatic dysfunction of cervical region: Secondary | ICD-10-CM | POA: Diagnosis not present

## 2020-10-16 DIAGNOSIS — M9902 Segmental and somatic dysfunction of thoracic region: Secondary | ICD-10-CM | POA: Diagnosis not present

## 2020-10-16 DIAGNOSIS — M50322 Other cervical disc degeneration at C5-C6 level: Secondary | ICD-10-CM | POA: Diagnosis not present

## 2020-10-16 DIAGNOSIS — M5134 Other intervertebral disc degeneration, thoracic region: Secondary | ICD-10-CM | POA: Diagnosis not present

## 2020-10-24 DIAGNOSIS — M50322 Other cervical disc degeneration at C5-C6 level: Secondary | ICD-10-CM | POA: Diagnosis not present

## 2020-10-24 DIAGNOSIS — M9902 Segmental and somatic dysfunction of thoracic region: Secondary | ICD-10-CM | POA: Diagnosis not present

## 2020-10-24 DIAGNOSIS — M5134 Other intervertebral disc degeneration, thoracic region: Secondary | ICD-10-CM | POA: Diagnosis not present

## 2020-10-24 DIAGNOSIS — M9901 Segmental and somatic dysfunction of cervical region: Secondary | ICD-10-CM | POA: Diagnosis not present

## 2020-10-29 DIAGNOSIS — M9902 Segmental and somatic dysfunction of thoracic region: Secondary | ICD-10-CM | POA: Diagnosis not present

## 2020-10-29 DIAGNOSIS — M5134 Other intervertebral disc degeneration, thoracic region: Secondary | ICD-10-CM | POA: Diagnosis not present

## 2020-10-29 DIAGNOSIS — M9901 Segmental and somatic dysfunction of cervical region: Secondary | ICD-10-CM | POA: Diagnosis not present

## 2020-10-29 DIAGNOSIS — M50322 Other cervical disc degeneration at C5-C6 level: Secondary | ICD-10-CM | POA: Diagnosis not present

## 2020-11-04 DIAGNOSIS — M50322 Other cervical disc degeneration at C5-C6 level: Secondary | ICD-10-CM | POA: Diagnosis not present

## 2020-11-04 DIAGNOSIS — M9901 Segmental and somatic dysfunction of cervical region: Secondary | ICD-10-CM | POA: Diagnosis not present

## 2020-11-04 DIAGNOSIS — M9902 Segmental and somatic dysfunction of thoracic region: Secondary | ICD-10-CM | POA: Diagnosis not present

## 2020-11-04 DIAGNOSIS — M5134 Other intervertebral disc degeneration, thoracic region: Secondary | ICD-10-CM | POA: Diagnosis not present

## 2020-11-06 DIAGNOSIS — M9901 Segmental and somatic dysfunction of cervical region: Secondary | ICD-10-CM | POA: Diagnosis not present

## 2020-11-06 DIAGNOSIS — M9902 Segmental and somatic dysfunction of thoracic region: Secondary | ICD-10-CM | POA: Diagnosis not present

## 2020-11-06 DIAGNOSIS — M5134 Other intervertebral disc degeneration, thoracic region: Secondary | ICD-10-CM | POA: Diagnosis not present

## 2020-11-06 DIAGNOSIS — M50322 Other cervical disc degeneration at C5-C6 level: Secondary | ICD-10-CM | POA: Diagnosis not present

## 2020-11-11 DIAGNOSIS — M50322 Other cervical disc degeneration at C5-C6 level: Secondary | ICD-10-CM | POA: Diagnosis not present

## 2020-11-11 DIAGNOSIS — M5134 Other intervertebral disc degeneration, thoracic region: Secondary | ICD-10-CM | POA: Diagnosis not present

## 2020-11-11 DIAGNOSIS — M9901 Segmental and somatic dysfunction of cervical region: Secondary | ICD-10-CM | POA: Diagnosis not present

## 2020-11-11 DIAGNOSIS — M9902 Segmental and somatic dysfunction of thoracic region: Secondary | ICD-10-CM | POA: Diagnosis not present

## 2020-11-25 DIAGNOSIS — Z03818 Encounter for observation for suspected exposure to other biological agents ruled out: Secondary | ICD-10-CM | POA: Diagnosis not present

## 2020-11-25 DIAGNOSIS — M9902 Segmental and somatic dysfunction of thoracic region: Secondary | ICD-10-CM | POA: Diagnosis not present

## 2020-11-25 DIAGNOSIS — M9901 Segmental and somatic dysfunction of cervical region: Secondary | ICD-10-CM | POA: Diagnosis not present

## 2020-11-25 DIAGNOSIS — Z20822 Contact with and (suspected) exposure to covid-19: Secondary | ICD-10-CM | POA: Diagnosis not present

## 2020-11-25 DIAGNOSIS — M50322 Other cervical disc degeneration at C5-C6 level: Secondary | ICD-10-CM | POA: Diagnosis not present

## 2020-11-25 DIAGNOSIS — M5134 Other intervertebral disc degeneration, thoracic region: Secondary | ICD-10-CM | POA: Diagnosis not present

## 2020-11-26 DIAGNOSIS — L570 Actinic keratosis: Secondary | ICD-10-CM | POA: Diagnosis not present

## 2020-12-02 DIAGNOSIS — M9901 Segmental and somatic dysfunction of cervical region: Secondary | ICD-10-CM | POA: Diagnosis not present

## 2020-12-02 DIAGNOSIS — M9902 Segmental and somatic dysfunction of thoracic region: Secondary | ICD-10-CM | POA: Diagnosis not present

## 2020-12-02 DIAGNOSIS — M5033 Other cervical disc degeneration, cervicothoracic region: Secondary | ICD-10-CM | POA: Diagnosis not present

## 2020-12-05 DIAGNOSIS — M9901 Segmental and somatic dysfunction of cervical region: Secondary | ICD-10-CM | POA: Diagnosis not present

## 2020-12-05 DIAGNOSIS — M9902 Segmental and somatic dysfunction of thoracic region: Secondary | ICD-10-CM | POA: Diagnosis not present

## 2020-12-05 DIAGNOSIS — M5033 Other cervical disc degeneration, cervicothoracic region: Secondary | ICD-10-CM | POA: Diagnosis not present

## 2020-12-09 DIAGNOSIS — M9901 Segmental and somatic dysfunction of cervical region: Secondary | ICD-10-CM | POA: Diagnosis not present

## 2020-12-09 DIAGNOSIS — M5033 Other cervical disc degeneration, cervicothoracic region: Secondary | ICD-10-CM | POA: Diagnosis not present

## 2020-12-09 DIAGNOSIS — M9902 Segmental and somatic dysfunction of thoracic region: Secondary | ICD-10-CM | POA: Diagnosis not present

## 2020-12-24 DIAGNOSIS — L570 Actinic keratosis: Secondary | ICD-10-CM | POA: Diagnosis not present

## 2021-01-08 DIAGNOSIS — M9901 Segmental and somatic dysfunction of cervical region: Secondary | ICD-10-CM | POA: Diagnosis not present

## 2021-01-08 DIAGNOSIS — M9902 Segmental and somatic dysfunction of thoracic region: Secondary | ICD-10-CM | POA: Diagnosis not present

## 2021-01-08 DIAGNOSIS — H43813 Vitreous degeneration, bilateral: Secondary | ICD-10-CM | POA: Diagnosis not present

## 2021-01-08 DIAGNOSIS — M5033 Other cervical disc degeneration, cervicothoracic region: Secondary | ICD-10-CM | POA: Diagnosis not present

## 2021-01-20 DIAGNOSIS — M9901 Segmental and somatic dysfunction of cervical region: Secondary | ICD-10-CM | POA: Diagnosis not present

## 2021-01-20 DIAGNOSIS — M9902 Segmental and somatic dysfunction of thoracic region: Secondary | ICD-10-CM | POA: Diagnosis not present

## 2021-01-20 DIAGNOSIS — M5033 Other cervical disc degeneration, cervicothoracic region: Secondary | ICD-10-CM | POA: Diagnosis not present

## 2021-02-12 DIAGNOSIS — M9904 Segmental and somatic dysfunction of sacral region: Secondary | ICD-10-CM | POA: Diagnosis not present

## 2021-02-12 DIAGNOSIS — M5136 Other intervertebral disc degeneration, lumbar region: Secondary | ICD-10-CM | POA: Diagnosis not present

## 2021-02-12 DIAGNOSIS — M9903 Segmental and somatic dysfunction of lumbar region: Secondary | ICD-10-CM | POA: Diagnosis not present

## 2021-02-12 DIAGNOSIS — M9905 Segmental and somatic dysfunction of pelvic region: Secondary | ICD-10-CM | POA: Diagnosis not present

## 2021-02-17 DIAGNOSIS — M5136 Other intervertebral disc degeneration, lumbar region: Secondary | ICD-10-CM | POA: Diagnosis not present

## 2021-02-17 DIAGNOSIS — M9905 Segmental and somatic dysfunction of pelvic region: Secondary | ICD-10-CM | POA: Diagnosis not present

## 2021-02-17 DIAGNOSIS — M9903 Segmental and somatic dysfunction of lumbar region: Secondary | ICD-10-CM | POA: Diagnosis not present

## 2021-02-17 DIAGNOSIS — M9904 Segmental and somatic dysfunction of sacral region: Secondary | ICD-10-CM | POA: Diagnosis not present

## 2021-02-19 DIAGNOSIS — M9903 Segmental and somatic dysfunction of lumbar region: Secondary | ICD-10-CM | POA: Diagnosis not present

## 2021-02-19 DIAGNOSIS — M5136 Other intervertebral disc degeneration, lumbar region: Secondary | ICD-10-CM | POA: Diagnosis not present

## 2021-02-19 DIAGNOSIS — M9905 Segmental and somatic dysfunction of pelvic region: Secondary | ICD-10-CM | POA: Diagnosis not present

## 2021-02-19 DIAGNOSIS — M9904 Segmental and somatic dysfunction of sacral region: Secondary | ICD-10-CM | POA: Diagnosis not present

## 2021-02-25 DIAGNOSIS — M5136 Other intervertebral disc degeneration, lumbar region: Secondary | ICD-10-CM | POA: Diagnosis not present

## 2021-02-25 DIAGNOSIS — M9903 Segmental and somatic dysfunction of lumbar region: Secondary | ICD-10-CM | POA: Diagnosis not present

## 2021-02-25 DIAGNOSIS — M9905 Segmental and somatic dysfunction of pelvic region: Secondary | ICD-10-CM | POA: Diagnosis not present

## 2021-02-25 DIAGNOSIS — M9904 Segmental and somatic dysfunction of sacral region: Secondary | ICD-10-CM | POA: Diagnosis not present

## 2021-02-27 DIAGNOSIS — M9905 Segmental and somatic dysfunction of pelvic region: Secondary | ICD-10-CM | POA: Diagnosis not present

## 2021-02-27 DIAGNOSIS — M9903 Segmental and somatic dysfunction of lumbar region: Secondary | ICD-10-CM | POA: Diagnosis not present

## 2021-02-27 DIAGNOSIS — M9904 Segmental and somatic dysfunction of sacral region: Secondary | ICD-10-CM | POA: Diagnosis not present

## 2021-02-27 DIAGNOSIS — M5136 Other intervertebral disc degeneration, lumbar region: Secondary | ICD-10-CM | POA: Diagnosis not present

## 2021-03-05 DIAGNOSIS — M9904 Segmental and somatic dysfunction of sacral region: Secondary | ICD-10-CM | POA: Diagnosis not present

## 2021-03-05 DIAGNOSIS — M9903 Segmental and somatic dysfunction of lumbar region: Secondary | ICD-10-CM | POA: Diagnosis not present

## 2021-03-05 DIAGNOSIS — M5136 Other intervertebral disc degeneration, lumbar region: Secondary | ICD-10-CM | POA: Diagnosis not present

## 2021-03-05 DIAGNOSIS — M9905 Segmental and somatic dysfunction of pelvic region: Secondary | ICD-10-CM | POA: Diagnosis not present

## 2021-03-12 DIAGNOSIS — E785 Hyperlipidemia, unspecified: Secondary | ICD-10-CM | POA: Diagnosis not present

## 2021-03-13 DIAGNOSIS — M5136 Other intervertebral disc degeneration, lumbar region: Secondary | ICD-10-CM | POA: Diagnosis not present

## 2021-03-13 DIAGNOSIS — M9903 Segmental and somatic dysfunction of lumbar region: Secondary | ICD-10-CM | POA: Diagnosis not present

## 2021-03-13 DIAGNOSIS — M9905 Segmental and somatic dysfunction of pelvic region: Secondary | ICD-10-CM | POA: Diagnosis not present

## 2021-03-13 DIAGNOSIS — M9904 Segmental and somatic dysfunction of sacral region: Secondary | ICD-10-CM | POA: Diagnosis not present

## 2021-03-19 DIAGNOSIS — Z23 Encounter for immunization: Secondary | ICD-10-CM | POA: Diagnosis not present

## 2021-03-19 DIAGNOSIS — H9319 Tinnitus, unspecified ear: Secondary | ICD-10-CM | POA: Diagnosis not present

## 2021-03-19 DIAGNOSIS — K589 Irritable bowel syndrome without diarrhea: Secondary | ICD-10-CM | POA: Diagnosis not present

## 2021-03-19 DIAGNOSIS — E785 Hyperlipidemia, unspecified: Secondary | ICD-10-CM | POA: Diagnosis not present

## 2021-03-19 DIAGNOSIS — Z0000 Encounter for general adult medical examination without abnormal findings: Secondary | ICD-10-CM | POA: Diagnosis not present

## 2021-03-19 DIAGNOSIS — R42 Dizziness and giddiness: Secondary | ICD-10-CM | POA: Diagnosis not present

## 2021-03-19 DIAGNOSIS — Z1212 Encounter for screening for malignant neoplasm of rectum: Secondary | ICD-10-CM | POA: Diagnosis not present

## 2021-03-19 DIAGNOSIS — R82998 Other abnormal findings in urine: Secondary | ICD-10-CM | POA: Diagnosis not present

## 2021-04-08 DIAGNOSIS — Z86018 Personal history of other benign neoplasm: Secondary | ICD-10-CM | POA: Diagnosis not present

## 2021-04-08 DIAGNOSIS — M9904 Segmental and somatic dysfunction of sacral region: Secondary | ICD-10-CM | POA: Diagnosis not present

## 2021-04-08 DIAGNOSIS — D2272 Melanocytic nevi of left lower limb, including hip: Secondary | ICD-10-CM | POA: Diagnosis not present

## 2021-04-08 DIAGNOSIS — Z85828 Personal history of other malignant neoplasm of skin: Secondary | ICD-10-CM | POA: Diagnosis not present

## 2021-04-08 DIAGNOSIS — D2261 Melanocytic nevi of right upper limb, including shoulder: Secondary | ICD-10-CM | POA: Diagnosis not present

## 2021-04-08 DIAGNOSIS — L578 Other skin changes due to chronic exposure to nonionizing radiation: Secondary | ICD-10-CM | POA: Diagnosis not present

## 2021-04-08 DIAGNOSIS — L570 Actinic keratosis: Secondary | ICD-10-CM | POA: Diagnosis not present

## 2021-04-08 DIAGNOSIS — L821 Other seborrheic keratosis: Secondary | ICD-10-CM | POA: Diagnosis not present

## 2021-04-08 DIAGNOSIS — D225 Melanocytic nevi of trunk: Secondary | ICD-10-CM | POA: Diagnosis not present

## 2021-04-08 DIAGNOSIS — M5136 Other intervertebral disc degeneration, lumbar region: Secondary | ICD-10-CM | POA: Diagnosis not present

## 2021-04-08 DIAGNOSIS — M9903 Segmental and somatic dysfunction of lumbar region: Secondary | ICD-10-CM | POA: Diagnosis not present

## 2021-04-08 DIAGNOSIS — M9905 Segmental and somatic dysfunction of pelvic region: Secondary | ICD-10-CM | POA: Diagnosis not present

## 2021-04-10 DIAGNOSIS — M9905 Segmental and somatic dysfunction of pelvic region: Secondary | ICD-10-CM | POA: Diagnosis not present

## 2021-04-10 DIAGNOSIS — M9904 Segmental and somatic dysfunction of sacral region: Secondary | ICD-10-CM | POA: Diagnosis not present

## 2021-04-10 DIAGNOSIS — M9903 Segmental and somatic dysfunction of lumbar region: Secondary | ICD-10-CM | POA: Diagnosis not present

## 2021-04-10 DIAGNOSIS — M5136 Other intervertebral disc degeneration, lumbar region: Secondary | ICD-10-CM | POA: Diagnosis not present

## 2021-06-03 DIAGNOSIS — M5136 Other intervertebral disc degeneration, lumbar region: Secondary | ICD-10-CM | POA: Diagnosis not present

## 2021-06-03 DIAGNOSIS — M9905 Segmental and somatic dysfunction of pelvic region: Secondary | ICD-10-CM | POA: Diagnosis not present

## 2021-06-03 DIAGNOSIS — M9903 Segmental and somatic dysfunction of lumbar region: Secondary | ICD-10-CM | POA: Diagnosis not present

## 2021-06-03 DIAGNOSIS — M9904 Segmental and somatic dysfunction of sacral region: Secondary | ICD-10-CM | POA: Diagnosis not present

## 2021-06-30 DIAGNOSIS — M9905 Segmental and somatic dysfunction of pelvic region: Secondary | ICD-10-CM | POA: Diagnosis not present

## 2021-06-30 DIAGNOSIS — M9904 Segmental and somatic dysfunction of sacral region: Secondary | ICD-10-CM | POA: Diagnosis not present

## 2021-06-30 DIAGNOSIS — M5136 Other intervertebral disc degeneration, lumbar region: Secondary | ICD-10-CM | POA: Diagnosis not present

## 2021-06-30 DIAGNOSIS — M9903 Segmental and somatic dysfunction of lumbar region: Secondary | ICD-10-CM | POA: Diagnosis not present

## 2021-07-07 DIAGNOSIS — M9903 Segmental and somatic dysfunction of lumbar region: Secondary | ICD-10-CM | POA: Diagnosis not present

## 2021-07-07 DIAGNOSIS — M9905 Segmental and somatic dysfunction of pelvic region: Secondary | ICD-10-CM | POA: Diagnosis not present

## 2021-07-07 DIAGNOSIS — M5136 Other intervertebral disc degeneration, lumbar region: Secondary | ICD-10-CM | POA: Diagnosis not present

## 2021-07-07 DIAGNOSIS — M9904 Segmental and somatic dysfunction of sacral region: Secondary | ICD-10-CM | POA: Diagnosis not present

## 2021-07-09 DIAGNOSIS — D2239 Melanocytic nevi of other parts of face: Secondary | ICD-10-CM | POA: Diagnosis not present

## 2021-07-09 DIAGNOSIS — D485 Neoplasm of uncertain behavior of skin: Secondary | ICD-10-CM | POA: Diagnosis not present

## 2021-07-09 DIAGNOSIS — L570 Actinic keratosis: Secondary | ICD-10-CM | POA: Diagnosis not present

## 2021-07-10 DIAGNOSIS — M9905 Segmental and somatic dysfunction of pelvic region: Secondary | ICD-10-CM | POA: Diagnosis not present

## 2021-07-10 DIAGNOSIS — M5136 Other intervertebral disc degeneration, lumbar region: Secondary | ICD-10-CM | POA: Diagnosis not present

## 2021-07-10 DIAGNOSIS — M9904 Segmental and somatic dysfunction of sacral region: Secondary | ICD-10-CM | POA: Diagnosis not present

## 2021-07-10 DIAGNOSIS — M9903 Segmental and somatic dysfunction of lumbar region: Secondary | ICD-10-CM | POA: Diagnosis not present

## 2021-07-21 DIAGNOSIS — M5136 Other intervertebral disc degeneration, lumbar region: Secondary | ICD-10-CM | POA: Diagnosis not present

## 2021-07-21 DIAGNOSIS — M9905 Segmental and somatic dysfunction of pelvic region: Secondary | ICD-10-CM | POA: Diagnosis not present

## 2021-07-21 DIAGNOSIS — M9903 Segmental and somatic dysfunction of lumbar region: Secondary | ICD-10-CM | POA: Diagnosis not present

## 2021-07-21 DIAGNOSIS — M9904 Segmental and somatic dysfunction of sacral region: Secondary | ICD-10-CM | POA: Diagnosis not present

## 2021-07-28 DIAGNOSIS — M9903 Segmental and somatic dysfunction of lumbar region: Secondary | ICD-10-CM | POA: Diagnosis not present

## 2021-07-28 DIAGNOSIS — M9904 Segmental and somatic dysfunction of sacral region: Secondary | ICD-10-CM | POA: Diagnosis not present

## 2021-07-28 DIAGNOSIS — M9905 Segmental and somatic dysfunction of pelvic region: Secondary | ICD-10-CM | POA: Diagnosis not present

## 2021-07-28 DIAGNOSIS — M5136 Other intervertebral disc degeneration, lumbar region: Secondary | ICD-10-CM | POA: Diagnosis not present

## 2021-07-30 DIAGNOSIS — M5136 Other intervertebral disc degeneration, lumbar region: Secondary | ICD-10-CM | POA: Diagnosis not present

## 2021-07-30 DIAGNOSIS — M9903 Segmental and somatic dysfunction of lumbar region: Secondary | ICD-10-CM | POA: Diagnosis not present

## 2021-07-30 DIAGNOSIS — M9905 Segmental and somatic dysfunction of pelvic region: Secondary | ICD-10-CM | POA: Diagnosis not present

## 2021-07-30 DIAGNOSIS — M9904 Segmental and somatic dysfunction of sacral region: Secondary | ICD-10-CM | POA: Diagnosis not present

## 2021-07-31 DIAGNOSIS — M9903 Segmental and somatic dysfunction of lumbar region: Secondary | ICD-10-CM | POA: Diagnosis not present

## 2021-07-31 DIAGNOSIS — M9904 Segmental and somatic dysfunction of sacral region: Secondary | ICD-10-CM | POA: Diagnosis not present

## 2021-07-31 DIAGNOSIS — M9905 Segmental and somatic dysfunction of pelvic region: Secondary | ICD-10-CM | POA: Diagnosis not present

## 2021-07-31 DIAGNOSIS — M5136 Other intervertebral disc degeneration, lumbar region: Secondary | ICD-10-CM | POA: Diagnosis not present

## 2021-08-11 DIAGNOSIS — Z780 Asymptomatic menopausal state: Secondary | ICD-10-CM | POA: Diagnosis not present

## 2021-08-12 DIAGNOSIS — M5136 Other intervertebral disc degeneration, lumbar region: Secondary | ICD-10-CM | POA: Diagnosis not present

## 2021-08-12 DIAGNOSIS — M9905 Segmental and somatic dysfunction of pelvic region: Secondary | ICD-10-CM | POA: Diagnosis not present

## 2021-08-12 DIAGNOSIS — M9903 Segmental and somatic dysfunction of lumbar region: Secondary | ICD-10-CM | POA: Diagnosis not present

## 2021-08-12 DIAGNOSIS — M9904 Segmental and somatic dysfunction of sacral region: Secondary | ICD-10-CM | POA: Diagnosis not present

## 2021-08-12 DIAGNOSIS — Z1231 Encounter for screening mammogram for malignant neoplasm of breast: Secondary | ICD-10-CM | POA: Diagnosis not present

## 2021-08-13 ENCOUNTER — Encounter: Payer: Self-pay | Admitting: Obstetrics and Gynecology

## 2021-08-14 DIAGNOSIS — M5136 Other intervertebral disc degeneration, lumbar region: Secondary | ICD-10-CM | POA: Diagnosis not present

## 2021-08-14 DIAGNOSIS — M5134 Other intervertebral disc degeneration, thoracic region: Secondary | ICD-10-CM | POA: Diagnosis not present

## 2021-08-14 DIAGNOSIS — M9903 Segmental and somatic dysfunction of lumbar region: Secondary | ICD-10-CM | POA: Diagnosis not present

## 2021-08-14 DIAGNOSIS — M9902 Segmental and somatic dysfunction of thoracic region: Secondary | ICD-10-CM | POA: Diagnosis not present

## 2021-08-27 DIAGNOSIS — M9903 Segmental and somatic dysfunction of lumbar region: Secondary | ICD-10-CM | POA: Diagnosis not present

## 2021-08-27 DIAGNOSIS — M5136 Other intervertebral disc degeneration, lumbar region: Secondary | ICD-10-CM | POA: Diagnosis not present

## 2021-08-27 DIAGNOSIS — M5134 Other intervertebral disc degeneration, thoracic region: Secondary | ICD-10-CM | POA: Diagnosis not present

## 2021-08-27 DIAGNOSIS — M9902 Segmental and somatic dysfunction of thoracic region: Secondary | ICD-10-CM | POA: Diagnosis not present

## 2021-09-03 DIAGNOSIS — R42 Dizziness and giddiness: Secondary | ICD-10-CM | POA: Diagnosis not present

## 2021-09-03 DIAGNOSIS — R413 Other amnesia: Secondary | ICD-10-CM | POA: Diagnosis not present

## 2021-09-05 ENCOUNTER — Other Ambulatory Visit: Payer: Self-pay | Admitting: Registered Nurse

## 2021-09-05 DIAGNOSIS — R42 Dizziness and giddiness: Secondary | ICD-10-CM

## 2021-09-05 DIAGNOSIS — R413 Other amnesia: Secondary | ICD-10-CM

## 2021-09-10 ENCOUNTER — Ambulatory Visit
Admission: RE | Admit: 2021-09-10 | Discharge: 2021-09-10 | Disposition: A | Discharge status: Home / Self Care | Payer: Medicare HMO | Source: Ambulatory Visit | Attending: Registered Nurse | Admitting: Registered Nurse

## 2021-09-10 ENCOUNTER — Other Ambulatory Visit: Payer: Self-pay

## 2021-09-10 DIAGNOSIS — R42 Dizziness and giddiness: Secondary | ICD-10-CM

## 2021-09-10 DIAGNOSIS — R413 Other amnesia: Secondary | ICD-10-CM

## 2021-09-15 ENCOUNTER — Other Ambulatory Visit: Payer: Medicare HMO

## 2021-10-07 DIAGNOSIS — M9902 Segmental and somatic dysfunction of thoracic region: Secondary | ICD-10-CM | POA: Diagnosis not present

## 2021-10-07 DIAGNOSIS — M5134 Other intervertebral disc degeneration, thoracic region: Secondary | ICD-10-CM | POA: Diagnosis not present

## 2021-10-07 DIAGNOSIS — M9903 Segmental and somatic dysfunction of lumbar region: Secondary | ICD-10-CM | POA: Diagnosis not present

## 2021-10-07 DIAGNOSIS — M5136 Other intervertebral disc degeneration, lumbar region: Secondary | ICD-10-CM | POA: Diagnosis not present

## 2021-10-07 NOTE — Progress Notes (Signed)
66 y.o. G59P2012 Married Caucasian female here for annual breast and pelvic exam.   ? ?Patient has a question regarding Estradiol tablet. ?Asking if it will help her mood.  ? ?No night sweats but does get hot at night but not every night.  ?Using flannel PJs.  ?No more hot flashes.  ? ?No bladder concerns.  ? ?PCP:  Burnard Bunting, MD  ? ?No LMP recorded. Patient has had a hysterectomy.     ?  ?    ?Sexually active: No.  ?The current method of family planning is status post hysterectomy.    ?Exercising: Yes.     Yard work, walking ?Smoker:  no ? ?Health Maintenance: ?Pap:  10-10-20 Neg, 07-18-18 Neg:Neg HR HPV, 07-14-17 Neg ?History of abnormal Pap:  yes, 1985 hx of cryotherapy to cervix for CIN II and CIN III.  Follow up paps normal. ?MMG:  08-13-21 Implants stable/Neg/Birads2 ?Colonoscopy:   11-19-16 polyp- repeat 5 years.  She will follow up with her GI. ?BMD:  08-13-21  Result:  normal.  T score -1.0. ?TDaP:  PCP ?Gardasil:   n/a ?HIV: no ?Hep C: no ?Screening Labs:  PCP. ? ? reports that she has never smoked. She has never used smokeless tobacco. She reports current alcohol use. She reports that she does not use drugs. ? ?Past Medical History:  ?Diagnosis Date  ? Abnormal Pap smear of cervix 1986  ? --hx cryotherapy to cervix for CIN II and CIN III  ? Allergy   ? Arthritis   ? Colon polyps   ? adenomatous  ? Diverticulosis   ? ? ?Past Surgical History:  ?Procedure Laterality Date  ? ABDOMINAL HYSTERECTOMY  1995  ? TVH--ovaries remain  ? [place]  ? silicone  ? COLONOSCOPY    ? CYSTOSCOPY  03/08/2012  ? Procedure: CYSTOSCOPY;  Surgeon: Reece Packer, MD;  Location: [facility];  Service: Urology;  Laterality: N/A;  ? RECTOCELE REPAIR  03/08/2012  ? Procedure: POSTERIOR REPAIR (RECTOCELE);  Surgeon: Reece Packer, MD;  Location: [facility];  Service: Urology;  Laterality: N/A;  ? VAGINAL PROLAPSE REPAIR  03/08/2012  ? Procedure: VAGINAL VAULT SUSPENSION;  Surgeon: Reece Packer, MD;  Location:  [facility];  Service: Urology;  Laterality: N/A;  with Graft  ? ? ?Current Outpatient Medications  ?Medication Sig Dispense Refill  ? fluorouracil (EFUDEX) 5 % cream 1 application    ? meclizine (ANTIVERT) 25 MG tablet Take 1 tablet by mouth 3 (three) times daily as needed.    ? ?No current facility-administered medications for this visit.  ? ? ?Family History  ?Problem Relation Age of Onset  ? Thyroid disease Mother   ?     goiter  ? Stroke Father   ? Heart attack Father   ? Hypertension Brother   ? Heart disease Maternal Grandfather   ? Colon cancer Neg Hx   ? Stomach cancer Neg Hx   ? Esophageal cancer Neg Hx   ? Rectal cancer Neg Hx   ? ? ?Review of Systems  ?All other systems reviewed and are negative. ? ?Exam:   ?BP 124/78   Pulse 81   Resp (!) 97   Ht '5\' 5"'$  (1.651 m)   Wt 170 lb (77.1 kg)   BMI 28.29 kg/m?     ?General appearance: alert, cooperative and appears stated age ?Head: normocephalic, without obvious abnormality, atraumatic ?Neck: no adenopathy, supple, symmetrical, trachea midline and thyroid normal to inspection and palpation ?Lungs:  clear to auscultation bilaterally ?Breasts: consistent with bilateral breast augmentation, no masses or tenderness, No nipple retraction or dimpling, No nipple discharge or bleeding, No axillary adenopathy ?Heart: regular rate and rhythm ?Abdomen: soft, non-tender; no masses, no organomegaly ?Extremities: extremities normal, atraumatic, no cyanosis or edema ?Skin: skin color, texture, turgor normal. No rashes or lesions ?Lymph nodes: cervical, supraclavicular, and axillary nodes normal. ?Neurologic: grossly normal ? ?Pelvic: External genitalia:  no lesions ?             No abnormal inguinal nodes palpated. ?             Urethra:  normal appearing urethra with no masses, tenderness or lesions ?             Bartholins and Skenes: normal    ?             Vagina: normal appearing vagina with normal color and discharge, no lesions.   ?             Cervix: absent ?              Pap taken: no ?Bimanual Exam:  Uterus:  absent.  Tender with vaginal exam.  ?             Adnexa: no mass, fullness, tenderness ?             Rectal exam: yes.  Confirms. ?             Anus:  normal sphincter tone, no lesions ? ?Chaperone was present for exam:  Estill Bamberg, CMA ? ?Assessment:   ?Well woman visit with gynecologic exam. ?Status post TVH.  ?Hx CIN II/III.  ?Ovaries remain.  ?Vaginal atrophy.  ?Status post rectocele repair with vaginal vault suspension. ?Normal bone density.  T score actually -1.0. ?Bilateral breast augmentation.  ?Health education encounter.  ? ?Plan: ?Mammogram screening discussed. ?Self breast awareness reviewed. ?Pap and HR HPV as above. ?Guidelines for Calcium, Vitamin D, regular exercise program including cardiovascular and weight bearing exercise. ?We discussed WHI and increased risk of stroke, DVT, and PE with ERT use at her current age.  ?I did differentiate oral from local vaginal estrogen use.   ?I discussed indication and benefits of local vaginal estrogen.  She declines this.  ?Follow up in 2 years and prn. ? ?After visit summary provided.  ? ? ? ?

## 2021-10-13 ENCOUNTER — Encounter: Payer: Self-pay | Admitting: Obstetrics and Gynecology

## 2021-10-13 ENCOUNTER — Ambulatory Visit (INDEPENDENT_AMBULATORY_CARE_PROVIDER_SITE_OTHER): Payer: Medicare HMO | Admitting: Obstetrics and Gynecology

## 2021-10-13 VITALS — BP 124/78 | HR 81 | Resp 97 | Ht 65.0 in | Wt 170.0 lb

## 2021-10-13 DIAGNOSIS — Z719 Counseling, unspecified: Secondary | ICD-10-CM

## 2021-10-13 DIAGNOSIS — Z01419 Encounter for gynecological examination (general) (routine) without abnormal findings: Secondary | ICD-10-CM | POA: Diagnosis not present

## 2021-10-13 NOTE — Patient Instructions (Signed)
EXERCISE AND DIET:  We recommended that you start or continue a regular exercise program for good health. Regular exercise means any activity that makes your heart beat faster and makes you sweat.  We recommend exercising at least 30 minutes per day at least 3 days a week, preferably 4 or 5.  We also recommend a diet low in fat and sugar.  Inactivity, poor dietary choices and obesity can cause diabetes, heart attack, stroke, and kidney damage, among others.   ? ?ALCOHOL AND SMOKING:  Women should limit their alcohol intake to no more than 7 drinks/beers/glasses of wine (combined, not each!) per week. Moderation of alcohol intake to this level decreases your risk of breast cancer and liver damage. And of course, no recreational drugs are part of a healthy lifestyle.  And absolutely no smoking or even second hand smoke. Most people know smoking can cause heart and lung diseases, but did you know it also contributes to weakening of your bones? Aging of your skin?  Yellowing of your teeth and nails? ? ?CALCIUM AND VITAMIN D:  Adequate intake of calcium and Vitamin D are recommended.  The recommendations for exact amounts of these supplements seem to change often, but generally speaking 600 mg of calcium (either carbonate or citrate) and 800 units of Vitamin D per day seems prudent. Certain women may benefit from higher intake of Vitamin D.  If you are among these women, your doctor will have told you during your visit.   ? ?PAP SMEARS:  Pap smears, to check for cervical cancer or precancers,  have traditionally been done yearly, although recent scientific advances have shown that most women can have pap smears less often.  However, every woman still should have a physical exam from her gynecologist every year. It will include a breast check, inspection of the vulva and vagina to check for abnormal growths or skin changes, a visual exam of the cervix, and then an exam to evaluate the size and shape of the uterus and  ovaries.  And after 67 years of age, a rectal exam is indicated to check for rectal cancers. We will also provide age appropriate advice regarding health maintenance, like when you should have certain vaccines, screening for sexually transmitted diseases, bone density testing, colonoscopy, mammograms, etc.  ? ?MAMMOGRAMS:  All women over 45 years old should have a yearly mammogram. Many facilities now offer a "3D" mammogram, which may cost around $50 extra out of pocket. If possible,  we recommend you accept the option to have the 3D mammogram performed.  It both reduces the number of women who will be called back for extra views which then turn out to be normal, and it is better than the routine mammogram at detecting truly abnormal areas.   ? ?COLONOSCOPY:  Colonoscopy to screen for colon cancer is recommended for all women at age 18.  We know, you hate the idea of the prep.  We agree, BUT, having colon cancer and not knowing it is worse!!  Colon cancer so often starts as a polyp that can be seen and removed at colonscopy, which can quite literally save your life!  And if your first colonoscopy is normal and you have no family history of colon cancer, most women don't have to have it again for 10 years.  Once every ten years, you can do something that may end up saving your life, right?  We will be happy to help you get it scheduled when you are ready.  Be sure to check your insurance coverage so you understand how much it will cost.  It may be covered as a preventative service at no cost, but you should check your particular policy.   ? ?Calcium Content in Foods ?Calcium is the most abundant mineral in the body. Most of the body's calcium supply is stored in bones and teeth. Calcium helps many parts of the body function normally, including: ?Blood and blood vessels. ?Nerves. ?Hormones. ?Muscles. ?Bones and teeth. ?When your calcium stores are low, you may be at risk for low bone mass, bone loss, and broken bones  (fractures). When you get enough calcium, it helps to support strong bones and teeth throughout your life. ?Calcium is especially important for: ?Children during growth spurts. ?Girls during adolescence. ?Women who are pregnant or breastfeeding. ?Women after their menstrual cycle stops (postmenopause). ?Women whose menstrual cycle has stopped due to anorexia nervosa or regular intense exercise. ?People who cannot eat or digest dairy products. ?Vegans. ?Recommended daily amounts of calcium: ?Women (ages 94 to 75): 1,000 mg per day. ?Women (ages 68 and older): 1,200 mg per day. ?Men (ages 54 to 68): 1,000 mg per day. ?Men (ages 53 and older): 1,200 mg per day. ?Women (ages 51 to 23): 1,300 mg per day. ?Men (ages 30 to 2): 1,300 mg per day. ?General information ?Eat foods that are high in calcium. Try to get most of your calcium from food. ?Some people may benefit from taking calcium supplements. Check with your health care provider or diet and nutrition specialist (dietitian) before starting any calcium supplements. Calcium supplements may interact with certain medicines. Too much calcium may cause other health problems, such as constipation and kidney stones. ?For the body to absorb calcium, it needs vitamin D. Sources of vitamin D include: ?Skin exposure to direct sunlight. ?Foods, such as egg yolks, liver, mushrooms, saltwater fish, and fortified milk. ?Vitamin D supplements. Check with your health care provider or dietitian before starting any vitamin D supplements. ?What foods are high in calcium? ? ?Foods that are high in calcium contain more than 100 milligrams per serving. ?Fruits ?Fortified orange juice or other fruit juice, 300 mg per 8 oz serving. ?Vegetables ?Collard greens, 360 mg per 8 oz serving. ?Kale, 100 mg per 8 oz serving. ?Bok choy, 160 mg per 8 oz serving. ?Grains ?Fortified ready-to-eat cereals, 100 to 1,000 mg per 8 oz serving. ?Fortified frozen waffles, 200 mg in 2 waffles. ?Oatmeal, 140 mg in  1 cup. ?Meats and other proteins ?Sardines, canned with bones, 325 mg per 3 oz serving. ?Salmon, canned with bones, 180 mg per 3 oz serving. ?Canned shrimp, 125 mg per 3 oz serving. ?Baked beans, 160 mg per 4 oz serving. ?Tofu, firm, made with calcium sulfate, 253 mg per 4 oz serving. ?Dairy ?Yogurt, plain, low-fat, 310 mg per 6 oz serving. ?Nonfat milk, 300 mg per 8 oz serving. ?American cheese, 195 mg per 1 oz serving. ?Cheddar cheese, 205 mg per 1 oz serving. ?Cottage cheese 2%, 105 mg per 4 oz serving. ?Fortified soy, rice, or almond milk, 300 mg per 8 oz serving. ?Mozzarella, part skim, 210 mg per 1 oz serving. ?The items listed above may not be a complete list of foods high in calcium. Actual amounts of calcium may be different depending on processing. Contact a dietitian for more information. ?What foods are lower in calcium? ?Foods that are lower in calcium contain 50 mg or less per serving. ?Fruits ?Apple, about 6 mg. ?Banana, about 12 mg. ?  Vegetables ?Lettuce, 19 mg per 2 oz serving. ?Tomato, about 11 mg. ?Grains ?Rice, 4 mg per 6 oz serving. ?Boiled potatoes, 14 mg per 8 oz serving. ?White bread, 6 mg per slice. ?Meats and other proteins ?Egg, 27 mg per 2 oz serving. ?Red meat, 7 mg per 4 oz serving. ?Chicken, 17 mg per 4 oz serving. ?Fish, cod, or trout, 20 mg per 4 oz serving. ?Dairy ?Cream cheese, regular, 14 mg per 1 Tbsp serving. ?Brie cheese, 50 mg per 1 oz serving. ?Parmesan cheese, 70 mg per 1 Tbsp serving. ?The items listed above may not be a complete list of foods lower in calcium. Actual amounts of calcium may be different depending on processing. Contact a dietitian for more information. ?Summary ?Calcium is an important mineral in the body because it affects many functions. Getting enough calcium helps support strong bones and teeth throughout your life. ?Try to get most of your calcium from food. ?Calcium supplements may interact with certain medicines. Check with your health care provider  or dietitian before starting any calcium supplements. ?This information is not intended to replace advice given to you by your health care provider. Make sure you discuss any questions you have with your h

## 2021-10-30 DIAGNOSIS — M9904 Segmental and somatic dysfunction of sacral region: Secondary | ICD-10-CM | POA: Diagnosis not present

## 2021-10-30 DIAGNOSIS — M5136 Other intervertebral disc degeneration, lumbar region: Secondary | ICD-10-CM | POA: Diagnosis not present

## 2021-10-30 DIAGNOSIS — M9905 Segmental and somatic dysfunction of pelvic region: Secondary | ICD-10-CM | POA: Diagnosis not present

## 2021-10-30 DIAGNOSIS — M9903 Segmental and somatic dysfunction of lumbar region: Secondary | ICD-10-CM | POA: Diagnosis not present

## 2021-11-11 DIAGNOSIS — M9903 Segmental and somatic dysfunction of lumbar region: Secondary | ICD-10-CM | POA: Diagnosis not present

## 2021-11-11 DIAGNOSIS — M9905 Segmental and somatic dysfunction of pelvic region: Secondary | ICD-10-CM | POA: Diagnosis not present

## 2021-11-11 DIAGNOSIS — M5136 Other intervertebral disc degeneration, lumbar region: Secondary | ICD-10-CM | POA: Diagnosis not present

## 2021-11-11 DIAGNOSIS — M9904 Segmental and somatic dysfunction of sacral region: Secondary | ICD-10-CM | POA: Diagnosis not present

## 2021-11-24 DIAGNOSIS — M5136 Other intervertebral disc degeneration, lumbar region: Secondary | ICD-10-CM | POA: Diagnosis not present

## 2021-11-24 DIAGNOSIS — M9904 Segmental and somatic dysfunction of sacral region: Secondary | ICD-10-CM | POA: Diagnosis not present

## 2021-11-24 DIAGNOSIS — M9903 Segmental and somatic dysfunction of lumbar region: Secondary | ICD-10-CM | POA: Diagnosis not present

## 2021-11-24 DIAGNOSIS — M9905 Segmental and somatic dysfunction of pelvic region: Secondary | ICD-10-CM | POA: Diagnosis not present

## 2021-11-26 DIAGNOSIS — M9905 Segmental and somatic dysfunction of pelvic region: Secondary | ICD-10-CM | POA: Diagnosis not present

## 2021-11-26 DIAGNOSIS — M5136 Other intervertebral disc degeneration, lumbar region: Secondary | ICD-10-CM | POA: Diagnosis not present

## 2021-11-26 DIAGNOSIS — M9903 Segmental and somatic dysfunction of lumbar region: Secondary | ICD-10-CM | POA: Diagnosis not present

## 2021-11-26 DIAGNOSIS — M9904 Segmental and somatic dysfunction of sacral region: Secondary | ICD-10-CM | POA: Diagnosis not present

## 2021-12-02 DIAGNOSIS — M9905 Segmental and somatic dysfunction of pelvic region: Secondary | ICD-10-CM | POA: Diagnosis not present

## 2021-12-02 DIAGNOSIS — M9904 Segmental and somatic dysfunction of sacral region: Secondary | ICD-10-CM | POA: Diagnosis not present

## 2021-12-02 DIAGNOSIS — M5136 Other intervertebral disc degeneration, lumbar region: Secondary | ICD-10-CM | POA: Diagnosis not present

## 2021-12-02 DIAGNOSIS — M9903 Segmental and somatic dysfunction of lumbar region: Secondary | ICD-10-CM | POA: Diagnosis not present

## 2021-12-03 DIAGNOSIS — M9904 Segmental and somatic dysfunction of sacral region: Secondary | ICD-10-CM | POA: Diagnosis not present

## 2021-12-03 DIAGNOSIS — M9903 Segmental and somatic dysfunction of lumbar region: Secondary | ICD-10-CM | POA: Diagnosis not present

## 2021-12-03 DIAGNOSIS — M5136 Other intervertebral disc degeneration, lumbar region: Secondary | ICD-10-CM | POA: Diagnosis not present

## 2021-12-03 DIAGNOSIS — M9905 Segmental and somatic dysfunction of pelvic region: Secondary | ICD-10-CM | POA: Diagnosis not present

## 2021-12-04 ENCOUNTER — Encounter: Payer: Self-pay | Admitting: Internal Medicine

## 2021-12-08 DIAGNOSIS — M5136 Other intervertebral disc degeneration, lumbar region: Secondary | ICD-10-CM | POA: Diagnosis not present

## 2021-12-08 DIAGNOSIS — M9904 Segmental and somatic dysfunction of sacral region: Secondary | ICD-10-CM | POA: Diagnosis not present

## 2021-12-08 DIAGNOSIS — M9905 Segmental and somatic dysfunction of pelvic region: Secondary | ICD-10-CM | POA: Diagnosis not present

## 2021-12-08 DIAGNOSIS — M9903 Segmental and somatic dysfunction of lumbar region: Secondary | ICD-10-CM | POA: Diagnosis not present

## 2021-12-10 DIAGNOSIS — M9905 Segmental and somatic dysfunction of pelvic region: Secondary | ICD-10-CM | POA: Diagnosis not present

## 2021-12-10 DIAGNOSIS — M5136 Other intervertebral disc degeneration, lumbar region: Secondary | ICD-10-CM | POA: Diagnosis not present

## 2021-12-10 DIAGNOSIS — M9903 Segmental and somatic dysfunction of lumbar region: Secondary | ICD-10-CM | POA: Diagnosis not present

## 2021-12-10 DIAGNOSIS — M9904 Segmental and somatic dysfunction of sacral region: Secondary | ICD-10-CM | POA: Diagnosis not present

## 2021-12-17 DIAGNOSIS — M9905 Segmental and somatic dysfunction of pelvic region: Secondary | ICD-10-CM | POA: Diagnosis not present

## 2021-12-17 DIAGNOSIS — M9904 Segmental and somatic dysfunction of sacral region: Secondary | ICD-10-CM | POA: Diagnosis not present

## 2021-12-17 DIAGNOSIS — M5136 Other intervertebral disc degeneration, lumbar region: Secondary | ICD-10-CM | POA: Diagnosis not present

## 2021-12-17 DIAGNOSIS — M9903 Segmental and somatic dysfunction of lumbar region: Secondary | ICD-10-CM | POA: Diagnosis not present

## 2021-12-24 DIAGNOSIS — M9904 Segmental and somatic dysfunction of sacral region: Secondary | ICD-10-CM | POA: Diagnosis not present

## 2021-12-24 DIAGNOSIS — M5136 Other intervertebral disc degeneration, lumbar region: Secondary | ICD-10-CM | POA: Diagnosis not present

## 2021-12-24 DIAGNOSIS — M9905 Segmental and somatic dysfunction of pelvic region: Secondary | ICD-10-CM | POA: Diagnosis not present

## 2021-12-24 DIAGNOSIS — M9903 Segmental and somatic dysfunction of lumbar region: Secondary | ICD-10-CM | POA: Diagnosis not present

## 2022-01-05 DIAGNOSIS — M5033 Other cervical disc degeneration, cervicothoracic region: Secondary | ICD-10-CM | POA: Diagnosis not present

## 2022-01-05 DIAGNOSIS — M9901 Segmental and somatic dysfunction of cervical region: Secondary | ICD-10-CM | POA: Diagnosis not present

## 2022-01-20 DIAGNOSIS — B078 Other viral warts: Secondary | ICD-10-CM | POA: Diagnosis not present

## 2022-01-20 DIAGNOSIS — D485 Neoplasm of uncertain behavior of skin: Secondary | ICD-10-CM | POA: Diagnosis not present

## 2022-02-02 DIAGNOSIS — M5033 Other cervical disc degeneration, cervicothoracic region: Secondary | ICD-10-CM | POA: Diagnosis not present

## 2022-02-02 DIAGNOSIS — M9901 Segmental and somatic dysfunction of cervical region: Secondary | ICD-10-CM | POA: Diagnosis not present

## 2022-02-04 DIAGNOSIS — Z0100 Encounter for examination of eyes and vision without abnormal findings: Secondary | ICD-10-CM | POA: Diagnosis not present

## 2022-02-04 DIAGNOSIS — H43813 Vitreous degeneration, bilateral: Secondary | ICD-10-CM | POA: Diagnosis not present

## 2022-03-16 DIAGNOSIS — M9901 Segmental and somatic dysfunction of cervical region: Secondary | ICD-10-CM | POA: Diagnosis not present

## 2022-03-16 DIAGNOSIS — Z1212 Encounter for screening for malignant neoplasm of rectum: Secondary | ICD-10-CM | POA: Diagnosis not present

## 2022-03-16 DIAGNOSIS — M50321 Other cervical disc degeneration at C4-C5 level: Secondary | ICD-10-CM | POA: Diagnosis not present

## 2022-03-17 DIAGNOSIS — E785 Hyperlipidemia, unspecified: Secondary | ICD-10-CM | POA: Diagnosis not present

## 2022-03-17 DIAGNOSIS — R7989 Other specified abnormal findings of blood chemistry: Secondary | ICD-10-CM | POA: Diagnosis not present

## 2022-03-18 DIAGNOSIS — R82998 Other abnormal findings in urine: Secondary | ICD-10-CM | POA: Diagnosis not present

## 2022-03-23 DIAGNOSIS — Z1331 Encounter for screening for depression: Secondary | ICD-10-CM | POA: Diagnosis not present

## 2022-03-23 DIAGNOSIS — Z23 Encounter for immunization: Secondary | ICD-10-CM | POA: Diagnosis not present

## 2022-03-23 DIAGNOSIS — Z1339 Encounter for screening examination for other mental health and behavioral disorders: Secondary | ICD-10-CM | POA: Diagnosis not present

## 2022-03-23 DIAGNOSIS — Z0000 Encounter for general adult medical examination without abnormal findings: Secondary | ICD-10-CM | POA: Diagnosis not present

## 2022-03-30 DIAGNOSIS — M50321 Other cervical disc degeneration at C4-C5 level: Secondary | ICD-10-CM | POA: Diagnosis not present

## 2022-03-30 DIAGNOSIS — M9901 Segmental and somatic dysfunction of cervical region: Secondary | ICD-10-CM | POA: Diagnosis not present

## 2022-04-06 DIAGNOSIS — M50321 Other cervical disc degeneration at C4-C5 level: Secondary | ICD-10-CM | POA: Diagnosis not present

## 2022-04-06 DIAGNOSIS — M9901 Segmental and somatic dysfunction of cervical region: Secondary | ICD-10-CM | POA: Diagnosis not present

## 2022-04-08 DIAGNOSIS — Z85828 Personal history of other malignant neoplasm of skin: Secondary | ICD-10-CM | POA: Diagnosis not present

## 2022-04-08 DIAGNOSIS — Z86018 Personal history of other benign neoplasm: Secondary | ICD-10-CM | POA: Diagnosis not present

## 2022-04-08 DIAGNOSIS — D2272 Melanocytic nevi of left lower limb, including hip: Secondary | ICD-10-CM | POA: Diagnosis not present

## 2022-04-08 DIAGNOSIS — L578 Other skin changes due to chronic exposure to nonionizing radiation: Secondary | ICD-10-CM | POA: Diagnosis not present

## 2022-04-08 DIAGNOSIS — D2261 Melanocytic nevi of right upper limb, including shoulder: Secondary | ICD-10-CM | POA: Diagnosis not present

## 2022-04-08 DIAGNOSIS — D225 Melanocytic nevi of trunk: Secondary | ICD-10-CM | POA: Diagnosis not present

## 2022-04-08 DIAGNOSIS — L570 Actinic keratosis: Secondary | ICD-10-CM | POA: Diagnosis not present

## 2022-04-08 DIAGNOSIS — L821 Other seborrheic keratosis: Secondary | ICD-10-CM | POA: Diagnosis not present

## 2022-04-08 DIAGNOSIS — D485 Neoplasm of uncertain behavior of skin: Secondary | ICD-10-CM | POA: Diagnosis not present

## 2022-04-15 DIAGNOSIS — M9901 Segmental and somatic dysfunction of cervical region: Secondary | ICD-10-CM | POA: Diagnosis not present

## 2022-04-15 DIAGNOSIS — M50321 Other cervical disc degeneration at C4-C5 level: Secondary | ICD-10-CM | POA: Diagnosis not present

## 2022-04-20 DIAGNOSIS — R1031 Right lower quadrant pain: Secondary | ICD-10-CM | POA: Diagnosis not present

## 2022-04-20 DIAGNOSIS — M9905 Segmental and somatic dysfunction of pelvic region: Secondary | ICD-10-CM | POA: Diagnosis not present

## 2022-04-20 DIAGNOSIS — M5136 Other intervertebral disc degeneration, lumbar region: Secondary | ICD-10-CM | POA: Diagnosis not present

## 2022-04-20 DIAGNOSIS — E663 Overweight: Secondary | ICD-10-CM | POA: Diagnosis not present

## 2022-04-20 DIAGNOSIS — M9903 Segmental and somatic dysfunction of lumbar region: Secondary | ICD-10-CM | POA: Diagnosis not present

## 2022-04-20 DIAGNOSIS — M9904 Segmental and somatic dysfunction of sacral region: Secondary | ICD-10-CM | POA: Diagnosis not present

## 2022-04-20 DIAGNOSIS — K635 Polyp of colon: Secondary | ICD-10-CM | POA: Diagnosis not present

## 2022-04-20 DIAGNOSIS — K5730 Diverticulosis of large intestine without perforation or abscess without bleeding: Secondary | ICD-10-CM | POA: Diagnosis not present

## 2022-04-22 DIAGNOSIS — M9904 Segmental and somatic dysfunction of sacral region: Secondary | ICD-10-CM | POA: Diagnosis not present

## 2022-04-22 DIAGNOSIS — M9905 Segmental and somatic dysfunction of pelvic region: Secondary | ICD-10-CM | POA: Diagnosis not present

## 2022-04-22 DIAGNOSIS — M5136 Other intervertebral disc degeneration, lumbar region: Secondary | ICD-10-CM | POA: Diagnosis not present

## 2022-04-22 DIAGNOSIS — M9903 Segmental and somatic dysfunction of lumbar region: Secondary | ICD-10-CM | POA: Diagnosis not present

## 2022-05-05 DIAGNOSIS — M9905 Segmental and somatic dysfunction of pelvic region: Secondary | ICD-10-CM | POA: Diagnosis not present

## 2022-05-05 DIAGNOSIS — M9904 Segmental and somatic dysfunction of sacral region: Secondary | ICD-10-CM | POA: Diagnosis not present

## 2022-05-05 DIAGNOSIS — M9903 Segmental and somatic dysfunction of lumbar region: Secondary | ICD-10-CM | POA: Diagnosis not present

## 2022-05-05 DIAGNOSIS — M5136 Other intervertebral disc degeneration, lumbar region: Secondary | ICD-10-CM | POA: Diagnosis not present

## 2022-05-19 DIAGNOSIS — M5136 Other intervertebral disc degeneration, lumbar region: Secondary | ICD-10-CM | POA: Diagnosis not present

## 2022-05-19 DIAGNOSIS — M9905 Segmental and somatic dysfunction of pelvic region: Secondary | ICD-10-CM | POA: Diagnosis not present

## 2022-05-19 DIAGNOSIS — M9904 Segmental and somatic dysfunction of sacral region: Secondary | ICD-10-CM | POA: Diagnosis not present

## 2022-05-19 DIAGNOSIS — M9903 Segmental and somatic dysfunction of lumbar region: Secondary | ICD-10-CM | POA: Diagnosis not present

## 2022-06-04 DIAGNOSIS — D2262 Melanocytic nevi of left upper limb, including shoulder: Secondary | ICD-10-CM | POA: Diagnosis not present

## 2022-06-04 DIAGNOSIS — L570 Actinic keratosis: Secondary | ICD-10-CM | POA: Diagnosis not present

## 2022-06-04 DIAGNOSIS — D485 Neoplasm of uncertain behavior of skin: Secondary | ICD-10-CM | POA: Diagnosis not present

## 2022-07-29 DIAGNOSIS — M9904 Segmental and somatic dysfunction of sacral region: Secondary | ICD-10-CM | POA: Diagnosis not present

## 2022-07-29 DIAGNOSIS — M9905 Segmental and somatic dysfunction of pelvic region: Secondary | ICD-10-CM | POA: Diagnosis not present

## 2022-07-29 DIAGNOSIS — M5136 Other intervertebral disc degeneration, lumbar region: Secondary | ICD-10-CM | POA: Diagnosis not present

## 2022-07-29 DIAGNOSIS — M9903 Segmental and somatic dysfunction of lumbar region: Secondary | ICD-10-CM | POA: Diagnosis not present

## 2022-07-30 DIAGNOSIS — M9905 Segmental and somatic dysfunction of pelvic region: Secondary | ICD-10-CM | POA: Diagnosis not present

## 2022-07-30 DIAGNOSIS — M9903 Segmental and somatic dysfunction of lumbar region: Secondary | ICD-10-CM | POA: Diagnosis not present

## 2022-07-30 DIAGNOSIS — M9904 Segmental and somatic dysfunction of sacral region: Secondary | ICD-10-CM | POA: Diagnosis not present

## 2022-07-30 DIAGNOSIS — M5136 Other intervertebral disc degeneration, lumbar region: Secondary | ICD-10-CM | POA: Diagnosis not present

## 2022-08-05 DIAGNOSIS — M9904 Segmental and somatic dysfunction of sacral region: Secondary | ICD-10-CM | POA: Diagnosis not present

## 2022-08-05 DIAGNOSIS — M9905 Segmental and somatic dysfunction of pelvic region: Secondary | ICD-10-CM | POA: Diagnosis not present

## 2022-08-05 DIAGNOSIS — M9903 Segmental and somatic dysfunction of lumbar region: Secondary | ICD-10-CM | POA: Diagnosis not present

## 2022-08-05 DIAGNOSIS — M5136 Other intervertebral disc degeneration, lumbar region: Secondary | ICD-10-CM | POA: Diagnosis not present

## 2022-08-10 DIAGNOSIS — M9904 Segmental and somatic dysfunction of sacral region: Secondary | ICD-10-CM | POA: Diagnosis not present

## 2022-08-10 DIAGNOSIS — M5136 Other intervertebral disc degeneration, lumbar region: Secondary | ICD-10-CM | POA: Diagnosis not present

## 2022-08-10 DIAGNOSIS — M9903 Segmental and somatic dysfunction of lumbar region: Secondary | ICD-10-CM | POA: Diagnosis not present

## 2022-08-10 DIAGNOSIS — M9905 Segmental and somatic dysfunction of pelvic region: Secondary | ICD-10-CM | POA: Diagnosis not present

## 2022-08-11 DIAGNOSIS — M9903 Segmental and somatic dysfunction of lumbar region: Secondary | ICD-10-CM | POA: Diagnosis not present

## 2022-08-11 DIAGNOSIS — M9905 Segmental and somatic dysfunction of pelvic region: Secondary | ICD-10-CM | POA: Diagnosis not present

## 2022-08-11 DIAGNOSIS — M5136 Other intervertebral disc degeneration, lumbar region: Secondary | ICD-10-CM | POA: Diagnosis not present

## 2022-08-11 DIAGNOSIS — M9904 Segmental and somatic dysfunction of sacral region: Secondary | ICD-10-CM | POA: Diagnosis not present

## 2022-08-13 DIAGNOSIS — M9905 Segmental and somatic dysfunction of pelvic region: Secondary | ICD-10-CM | POA: Diagnosis not present

## 2022-08-13 DIAGNOSIS — M9904 Segmental and somatic dysfunction of sacral region: Secondary | ICD-10-CM | POA: Diagnosis not present

## 2022-08-13 DIAGNOSIS — M9903 Segmental and somatic dysfunction of lumbar region: Secondary | ICD-10-CM | POA: Diagnosis not present

## 2022-08-13 DIAGNOSIS — M5136 Other intervertebral disc degeneration, lumbar region: Secondary | ICD-10-CM | POA: Diagnosis not present

## 2022-08-31 DIAGNOSIS — M9903 Segmental and somatic dysfunction of lumbar region: Secondary | ICD-10-CM | POA: Diagnosis not present

## 2022-08-31 DIAGNOSIS — M9904 Segmental and somatic dysfunction of sacral region: Secondary | ICD-10-CM | POA: Diagnosis not present

## 2022-08-31 DIAGNOSIS — M5136 Other intervertebral disc degeneration, lumbar region: Secondary | ICD-10-CM | POA: Diagnosis not present

## 2022-08-31 DIAGNOSIS — M9905 Segmental and somatic dysfunction of pelvic region: Secondary | ICD-10-CM | POA: Diagnosis not present

## 2022-09-21 DIAGNOSIS — K589 Irritable bowel syndrome without diarrhea: Secondary | ICD-10-CM | POA: Diagnosis not present

## 2022-09-21 DIAGNOSIS — E663 Overweight: Secondary | ICD-10-CM | POA: Diagnosis not present

## 2022-09-21 DIAGNOSIS — E785 Hyperlipidemia, unspecified: Secondary | ICD-10-CM | POA: Diagnosis not present

## 2022-09-25 ENCOUNTER — Encounter: Payer: Self-pay | Admitting: Internal Medicine

## 2022-09-25 DIAGNOSIS — Z1231 Encounter for screening mammogram for malignant neoplasm of breast: Secondary | ICD-10-CM | POA: Diagnosis not present

## 2022-09-28 ENCOUNTER — Encounter: Payer: Self-pay | Admitting: Obstetrics and Gynecology

## 2022-10-06 DIAGNOSIS — M5136 Other intervertebral disc degeneration, lumbar region: Secondary | ICD-10-CM | POA: Diagnosis not present

## 2022-10-06 DIAGNOSIS — M9904 Segmental and somatic dysfunction of sacral region: Secondary | ICD-10-CM | POA: Diagnosis not present

## 2022-10-06 DIAGNOSIS — M9905 Segmental and somatic dysfunction of pelvic region: Secondary | ICD-10-CM | POA: Diagnosis not present

## 2022-10-06 DIAGNOSIS — M9903 Segmental and somatic dysfunction of lumbar region: Secondary | ICD-10-CM | POA: Diagnosis not present

## 2022-10-19 ENCOUNTER — Ambulatory Visit (AMBULATORY_SURGERY_CENTER): Payer: Medicare HMO

## 2022-10-19 ENCOUNTER — Encounter: Payer: Self-pay | Admitting: Internal Medicine

## 2022-10-19 VITALS — Ht 65.0 in | Wt 181.0 lb

## 2022-10-19 DIAGNOSIS — M9905 Segmental and somatic dysfunction of pelvic region: Secondary | ICD-10-CM | POA: Diagnosis not present

## 2022-10-19 DIAGNOSIS — Z86010 Personal history of colonic polyps: Secondary | ICD-10-CM

## 2022-10-19 DIAGNOSIS — M5136 Other intervertebral disc degeneration, lumbar region: Secondary | ICD-10-CM | POA: Diagnosis not present

## 2022-10-19 DIAGNOSIS — M9904 Segmental and somatic dysfunction of sacral region: Secondary | ICD-10-CM | POA: Diagnosis not present

## 2022-10-19 DIAGNOSIS — M9903 Segmental and somatic dysfunction of lumbar region: Secondary | ICD-10-CM | POA: Diagnosis not present

## 2022-10-19 MED ORDER — NA SULFATE-K SULFATE-MG SULF 17.5-3.13-1.6 GM/177ML PO SOLN: 1.0000 | Freq: Once | ORAL | 0 refills | Status: AC

## 2022-10-19 NOTE — Progress Notes (Signed)
No egg or soy allergy known to patient  No issues known to pt with past sedation with any surgeries or procedures Patient denies ever being told they had issues or difficulty with intubation  No FH of Malignant Hyperthermia Pt is not on diet pills Pt is not on  home 02  Pt is not on blood thinners  Pt denies issues with constipation  No A fib or A flutter Have any cardiac testing pending--no Pt instructed to use Singlecare.[email or web address] or GoodRx for a price reduction on prep  Patient's chart reviewed by Cathlyn Parsons CNRA prior to previsit and patient appropriate for the [facility].  Previsit completed and red dot placed by patient's name on their procedure day (on provider's schedule).   Can ambulate without asistance

## 2022-10-20 DIAGNOSIS — M5136 Other intervertebral disc degeneration, lumbar region: Secondary | ICD-10-CM | POA: Diagnosis not present

## 2022-10-20 DIAGNOSIS — M9903 Segmental and somatic dysfunction of lumbar region: Secondary | ICD-10-CM | POA: Diagnosis not present

## 2022-10-20 DIAGNOSIS — M9904 Segmental and somatic dysfunction of sacral region: Secondary | ICD-10-CM | POA: Diagnosis not present

## 2022-10-20 DIAGNOSIS — M9905 Segmental and somatic dysfunction of pelvic region: Secondary | ICD-10-CM | POA: Diagnosis not present

## 2022-10-29 DIAGNOSIS — M9903 Segmental and somatic dysfunction of lumbar region: Secondary | ICD-10-CM | POA: Diagnosis not present

## 2022-10-29 DIAGNOSIS — M9905 Segmental and somatic dysfunction of pelvic region: Secondary | ICD-10-CM | POA: Diagnosis not present

## 2022-10-29 DIAGNOSIS — M5136 Other intervertebral disc degeneration, lumbar region: Secondary | ICD-10-CM | POA: Diagnosis not present

## 2022-10-29 DIAGNOSIS — M9904 Segmental and somatic dysfunction of sacral region: Secondary | ICD-10-CM | POA: Diagnosis not present

## 2022-11-19 ENCOUNTER — Ambulatory Visit (AMBULATORY_SURGERY_CENTER): Payer: Medicare HMO | Admitting: Internal Medicine

## 2022-11-19 ENCOUNTER — Encounter: Payer: Self-pay | Admitting: Internal Medicine

## 2022-11-19 VITALS — BP 136/72 | HR 69 | Temp 97.5°F | Resp 12 | Ht 65.0 in | Wt 177.0 lb

## 2022-11-19 DIAGNOSIS — Z09 Encounter for follow-up examination after completed treatment for conditions other than malignant neoplasm: Secondary | ICD-10-CM

## 2022-11-19 DIAGNOSIS — Z86010 Personal history of colonic polyps: Secondary | ICD-10-CM | POA: Diagnosis not present

## 2022-11-19 MED ORDER — SODIUM CHLORIDE 0.9 % IV SOLN: 500.0000 mL | Freq: Once | INTRAVENOUS | Status: DC

## 2022-11-19 NOTE — Patient Instructions (Signed)

## 2022-11-19 NOTE — Progress Notes (Signed)
Report to PACU, RN, vss, BBS= Clear.  

## 2022-11-19 NOTE — Progress Notes (Signed)
67 y.o. G5P2012 Married Caucasian female here for breast and pelvic exam.   She is followed for vaginal atrophy.  Did not use vaginal estrogen.  Occasional urinary urgency.   Seems stable.   PCP:   Dr. Jacky Kindle  No LMP recorded. Patient has had a hysterectomy.           Sexually active: No.  The current method of family planning is status post hysterectomy.    Exercising: Yes.     Walking, yard work Smoker:  no  Health Maintenance: Pap:   10-10-20 Neg, 07-18-18 Neg:Neg HR HPV, 07-14-17 Neg  History of abnormal Pap:  yes, 1985 hx of cryotherapy to cervix for CIN II and CIN III.  Follow up paps normal.  MMG:  09/25/22 Breast Density Cat C, BI-RADS CAT 1 neg Colonoscopy:  11/19/22 - normal.  Due again in 10 years.   BMD:   08/13/21  Result  normal TDaP:  PCP Gardasil:   no HIV: n/a Hep C: n/a Screening Labs:  PCP   reports that she has never smoked. She has never used smokeless tobacco. She reports current alcohol use. She reports that she does not use drugs.  Past Medical History:  Diagnosis Date   Abnormal Pap smear of cervix 1986   --hx cryotherapy to cervix for CIN II and CIN III   Allergy    Arthritis    Colon polyps    adenomatous   Diverticulosis     Past Surgical History:  Procedure Laterality Date   ABDOMINAL HYSTERECTOMY  1995   TVH--ovaries remain   AUGMENTATION MAMMAPLASTY  1981   silicone   COLONOSCOPY     CYSTOSCOPY  03/08/2012   Procedure: CYSTOSCOPY;  Surgeon: Martina Sinner, MD;  Location: [facility];  Service: Urology;  Laterality: N/A;   RECTOCELE REPAIR  03/08/2012   Procedure: POSTERIOR REPAIR (RECTOCELE);  Surgeon: Martina Sinner, MD;  Location: [facility];  Service: Urology;  Laterality: N/A;   VAGINAL PROLAPSE REPAIR  03/08/2012   Procedure: VAGINAL VAULT SUSPENSION;  Surgeon: Martina Sinner, MD;  Location: [facility];  Service: Urology;  Laterality: N/A;  with Graft    Current Outpatient Medications  Medication Sig Dispense Refill   meclizine  (ANTIVERT) 25 MG tablet Take 1 tablet by mouth 3 (three) times daily as needed.     No current facility-administered medications for this visit.    Family History  Problem Relation Age of Onset   Thyroid disease Mother        goiter   Stroke Father    Heart attack Father    Hypertension Brother    Heart disease Maternal Grandfather    Colon cancer Neg Hx    Stomach cancer Neg Hx    Esophageal cancer Neg Hx    Rectal cancer Neg Hx    Colon polyps Neg Hx     Review of Systems  All other systems reviewed and are negative.   Exam:   BP 122/84 (BP Location: Right Arm, Patient Position: Sitting, Cuff Size: Large)   Pulse 87   Ht 5\' 5"  (1.651 m)   Wt 180 lb (81.6 kg)   SpO2 94%   BMI 29.95 kg/m     General appearance: alert, cooperative and appears stated age Head: normocephalic, without obvious abnormality, atraumatic Neck: no adenopathy, supple, symmetrical, trachea midline and thyroid normal to inspection and palpation Lungs: clear to auscultation bilaterally Breasts: bilateral implants, no masses or tenderness, No nipple retraction or dimpling, No  nipple discharge or bleeding, No axillary adenopathy Heart: regular rate and rhythm Abdomen: soft, non-tender; no masses, no organomegaly Extremities: extremities normal, atraumatic, no cyanosis or edema Skin: skin color, texture, turgor normal. No rashes or lesions Lymph nodes: cervical, supraclavicular, and axillary nodes normal. Neurologic: grossly normal  Pelvic: External genitalia:  no lesions              No abnormal inguinal nodes palpated.              Urethra:  normal appearing urethra with no masses, tenderness or lesions              Bartholins and Skenes: normal                 Vagina: normal appearing vagina with normal color and discharge, atrophy noted.               Cervix: absent              Pap taken: no Bimanual Exam:  Uterus:  absent              Adnexa: no mass, fullness, tenderness              Rectal  exam: yes.  Confirms.              Anus:  normal sphincter tone, no lesions  Chaperone was present for exam:  Warren Lacy, CMA  Assessment:   Well woman visit with gynecologic exam. Status post TVH.  Hx CIN II/III.  Ovaries remain.  Vaginal atrophy.  Status post rectocele repair with vaginal vault suspension. Normal bone density.  T score actually -1.0. Bilateral breast augmentation.   Plan: Mammogram screening discussed. Self breast awareness reviewed. Pap and HR HPV not indicated.  Guidelines for Calcium, Vitamin D, regular exercise program including cardiovascular and weight bearing exercise. We discussed vaginal estrogen treatment, cooking oils, and water based lubricants with vitamin E added.  She currently declines Rx for vaginal estrogen treatment.   Follow up in 1 - 2 years based on patient preference.   10 min  total time was spent for this patient encounter, including preparation, face-to-face counseling with the patient, coordination of care, and documentation of the encounter for breast and pelvic exam.

## 2022-11-19 NOTE — Op Note (Signed)
[facility] Patient Name: Karen Todd Procedure Date: 11/19/2022 8:44 AM MRN: [id number] Endoscopist: Wilhemina Bonito. Marina Goodell , MD, [id number] Age: 67 Referring MD:  Date of Birth: Nov 04, 1955 Gender: Female Account #: [id number] Procedure:                Colonoscopy Indications:              High risk colon cancer surveillance: Personal                            history of non-advanced adenomas. Previous                            examinations 2008, 2013, 2015 Medicines:                Monitored Anesthesia Care Procedure:                Pre-Anesthesia Assessment:                           - Prior to the procedure, a History and Physical                            was performed, and patient medications and                            allergies were reviewed. The patient's tolerance of                            previous anesthesia was also reviewed. The risks                            and benefits of the procedure and the sedation                            options and risks were discussed with the patient.                            All questions were answered, and informed consent                            was obtained. Prior Anticoagulants: The patient has                            taken no anticoagulant or antiplatelet agents. ASA                            Grade Assessment: II - A patient with mild systemic                            disease. After reviewing the risks and benefits,                            the patient was deemed in satisfactory condition to  undergo the procedure.                           After obtaining informed consent, the colonoscope                            was passed under direct vision. Throughout the                            procedure, the patient's blood pressure, pulse, and                            oxygen saturations were monitored continuously. The                            Olympus CF-HQ190L [id number]) Colonoscope  was                            introduced through the anus and advanced to the the                            cecum, identified by appendiceal orifice and                            ileocecal valve. The ileocecal valve, appendiceal                            orifice, and rectum were photographed. The quality                            of the bowel preparation was excellent. The                            colonoscopy was performed without difficulty. The                            patient tolerated the procedure well. The bowel                            preparation used was SUPREP via split dose                            instruction. Scope In: 9:03:07 AM Scope Out: 9:13:27 AM Scope Withdrawal Time: 0 hours 6 minutes 45 seconds  Total Procedure Duration: 0 hours 10 minutes 20 seconds  Findings:                 Multiple diverticula were found in the entire colon.                           The exam was otherwise without abnormality on                            direct and retroflexion views. Complications:            No immediate complications.  Estimated blood loss:                            None. Estimated Blood Loss:     Estimated blood loss: none. Impression:               - Diverticulosis in the entire examined colon.                           - The examination was otherwise normal on direct                            and retroflexion views.                           - No specimens collected. Recommendation:           - Repeat colonoscopy in 10 years for surveillance.                           - Patient has a contact number available for                            emergencies. The signs and symptoms of potential                            delayed complications were discussed with the                            patient. Return to normal activities tomorrow.                            Written discharge instructions were provided to the                            patient.                            - Resume previous diet.                           - Continue present medications. Wilhemina Bonito. Marina Goodell, MD 11/19/2022 9:17:38 AM This report has been signed electronically.

## 2022-11-19 NOTE — Progress Notes (Signed)
HISTORY OF PRESENT ILLNESS:  Karen Todd is a 67 y.o. female with a personal history of adenomatous colon polyps.  Now for surveillance colonoscopy  REVIEW OF SYSTEMS:  All non-GI ROS negative except for  Past Medical History:  Diagnosis Date   Abnormal Pap smear of cervix 1986   --hx cryotherapy to cervix for CIN II and CIN III   Allergy    Arthritis    Colon polyps    adenomatous   Diverticulosis     Past Surgical History:  Procedure Laterality Date   ABDOMINAL HYSTERECTOMY  1995   TVH--ovaries remain   AUGMENTATION MAMMAPLASTY  1981   silicone   COLONOSCOPY     CYSTOSCOPY  03/08/2012   Procedure: CYSTOSCOPY;  Surgeon: Martina Sinner, MD;  Location: [facility];  Service: Urology;  Laterality: N/A;   RECTOCELE REPAIR  03/08/2012   Procedure: POSTERIOR REPAIR (RECTOCELE);  Surgeon: Martina Sinner, MD;  Location: [facility];  Service: Urology;  Laterality: N/A;   VAGINAL PROLAPSE REPAIR  03/08/2012   Procedure: VAGINAL VAULT SUSPENSION;  Surgeon: Martina Sinner, MD;  Location: [facility];  Service: Urology;  Laterality: N/A;  with Graft    Social History Karen Todd  reports that she has never smoked. She has never used smokeless tobacco. She reports current alcohol use. She reports that she does not use drugs.  family history includes Heart attack in her father; Heart disease in her maternal grandfather; Hypertension in her brother; Stroke in her father; Thyroid disease in her mother.  Allergies  Allergen Reactions   Codeine Nausea And Vomiting   Sulfa Antibiotics Nausea And Vomiting   Tetracycline Nausea And Vomiting       PHYSICAL EXAMINATION: Vital signs: BP (!) 139/92 (BP Location: Right Arm, Patient Position: Sitting, Cuff Size: Normal)   Pulse 87   Temp (!) 97.5 F (36.4 C) (Temporal)   Ht 5\' 5"  (1.651 m)   Wt 177 lb (80.3 kg)   SpO2 97%   BMI 29.45 kg/m  General: Well-developed, well-nourished, no acute distress HEENT: Sclerae are anicteric,  conjunctiva pink. Oral mucosa intact Lungs: Clear Heart: Regular Abdomen: soft, nontender, nondistended, no obvious ascites, no peritoneal signs, normal bowel sounds. No organomegaly. Extremities: No edema Psychiatric: alert and oriented x3. Cooperative     ASSESSMENT:   History of adenomatous colon polyps  PLAN:  Surveillance colonoscopy

## 2022-11-20 ENCOUNTER — Telehealth: Payer: Self-pay

## 2022-11-20 NOTE — Telephone Encounter (Signed)
  Follow up Call-     11/19/2022    7:34 AM 11/19/2022    7:26 AM  Call back number  Post procedure Call Back phone  # [phone]   Permission to leave phone message  Yes     Patient questions:  Do you have a fever, pain , or abdominal swelling? No. Pain Score  0 *  Have you tolerated food without any problems? Yes.    Have you been able to return to your normal activities? Yes.    Do you have any questions about your discharge instructions: Diet   No. Medications  No. Follow up visit  No.  Do you have questions or concerns about your Care? No.  Actions: * If pain score is 4 or above: No action needed, pain <4.

## 2022-11-26 DIAGNOSIS — M9903 Segmental and somatic dysfunction of lumbar region: Secondary | ICD-10-CM | POA: Diagnosis not present

## 2022-11-26 DIAGNOSIS — M9904 Segmental and somatic dysfunction of sacral region: Secondary | ICD-10-CM | POA: Diagnosis not present

## 2022-11-26 DIAGNOSIS — M5136 Other intervertebral disc degeneration, lumbar region: Secondary | ICD-10-CM | POA: Diagnosis not present

## 2022-11-26 DIAGNOSIS — M9905 Segmental and somatic dysfunction of pelvic region: Secondary | ICD-10-CM | POA: Diagnosis not present

## 2022-12-01 DIAGNOSIS — M9904 Segmental and somatic dysfunction of sacral region: Secondary | ICD-10-CM | POA: Diagnosis not present

## 2022-12-01 DIAGNOSIS — M9905 Segmental and somatic dysfunction of pelvic region: Secondary | ICD-10-CM | POA: Diagnosis not present

## 2022-12-01 DIAGNOSIS — M5136 Other intervertebral disc degeneration, lumbar region: Secondary | ICD-10-CM | POA: Diagnosis not present

## 2022-12-01 DIAGNOSIS — M9903 Segmental and somatic dysfunction of lumbar region: Secondary | ICD-10-CM | POA: Diagnosis not present

## 2022-12-03 ENCOUNTER — Encounter: Payer: Self-pay | Admitting: Obstetrics and Gynecology

## 2022-12-03 ENCOUNTER — Ambulatory Visit (INDEPENDENT_AMBULATORY_CARE_PROVIDER_SITE_OTHER): Payer: Medicare HMO | Admitting: Obstetrics and Gynecology

## 2022-12-03 VITALS — BP 122/84 | HR 87 | Ht 65.0 in | Wt 180.0 lb

## 2022-12-03 DIAGNOSIS — Z01419 Encounter for gynecological examination (general) (routine) without abnormal findings: Secondary | ICD-10-CM | POA: Diagnosis not present

## 2022-12-03 DIAGNOSIS — N952 Postmenopausal atrophic vaginitis: Secondary | ICD-10-CM

## 2022-12-18 DIAGNOSIS — L301 Dyshidrosis [pompholyx]: Secondary | ICD-10-CM | POA: Diagnosis not present

## 2022-12-31 DIAGNOSIS — L301 Dyshidrosis [pompholyx]: Secondary | ICD-10-CM | POA: Diagnosis not present

## 2023-01-04 DIAGNOSIS — M9905 Segmental and somatic dysfunction of pelvic region: Secondary | ICD-10-CM | POA: Diagnosis not present

## 2023-01-04 DIAGNOSIS — M5136 Other intervertebral disc degeneration, lumbar region: Secondary | ICD-10-CM | POA: Diagnosis not present

## 2023-01-04 DIAGNOSIS — M9903 Segmental and somatic dysfunction of lumbar region: Secondary | ICD-10-CM | POA: Diagnosis not present

## 2023-01-04 DIAGNOSIS — M9904 Segmental and somatic dysfunction of sacral region: Secondary | ICD-10-CM | POA: Diagnosis not present

## 2023-01-13 DIAGNOSIS — M5136 Other intervertebral disc degeneration, lumbar region: Secondary | ICD-10-CM | POA: Diagnosis not present

## 2023-01-13 DIAGNOSIS — M9904 Segmental and somatic dysfunction of sacral region: Secondary | ICD-10-CM | POA: Diagnosis not present

## 2023-01-13 DIAGNOSIS — M9905 Segmental and somatic dysfunction of pelvic region: Secondary | ICD-10-CM | POA: Diagnosis not present

## 2023-01-13 DIAGNOSIS — M9903 Segmental and somatic dysfunction of lumbar region: Secondary | ICD-10-CM | POA: Diagnosis not present

## 2023-01-29 DIAGNOSIS — K5792 Diverticulitis of intestine, part unspecified, without perforation or abscess without bleeding: Secondary | ICD-10-CM | POA: Diagnosis not present

## 2023-01-29 DIAGNOSIS — K589 Irritable bowel syndrome without diarrhea: Secondary | ICD-10-CM | POA: Diagnosis not present

## 2023-01-29 DIAGNOSIS — M25552 Pain in left hip: Secondary | ICD-10-CM | POA: Diagnosis not present

## 2023-01-29 DIAGNOSIS — R1032 Left lower quadrant pain: Secondary | ICD-10-CM | POA: Diagnosis not present

## 2023-02-01 DIAGNOSIS — M9904 Segmental and somatic dysfunction of sacral region: Secondary | ICD-10-CM | POA: Diagnosis not present

## 2023-02-01 DIAGNOSIS — M9905 Segmental and somatic dysfunction of pelvic region: Secondary | ICD-10-CM | POA: Diagnosis not present

## 2023-02-01 DIAGNOSIS — M5136 Other intervertebral disc degeneration, lumbar region: Secondary | ICD-10-CM | POA: Diagnosis not present

## 2023-02-01 DIAGNOSIS — M9903 Segmental and somatic dysfunction of lumbar region: Secondary | ICD-10-CM | POA: Diagnosis not present

## 2023-02-08 DIAGNOSIS — M5136 Other intervertebral disc degeneration, lumbar region: Secondary | ICD-10-CM | POA: Diagnosis not present

## 2023-02-08 DIAGNOSIS — M9905 Segmental and somatic dysfunction of pelvic region: Secondary | ICD-10-CM | POA: Diagnosis not present

## 2023-02-08 DIAGNOSIS — M9903 Segmental and somatic dysfunction of lumbar region: Secondary | ICD-10-CM | POA: Diagnosis not present

## 2023-02-08 DIAGNOSIS — M9904 Segmental and somatic dysfunction of sacral region: Secondary | ICD-10-CM | POA: Diagnosis not present

## 2023-02-11 DIAGNOSIS — M9905 Segmental and somatic dysfunction of pelvic region: Secondary | ICD-10-CM | POA: Diagnosis not present

## 2023-02-11 DIAGNOSIS — M9904 Segmental and somatic dysfunction of sacral region: Secondary | ICD-10-CM | POA: Diagnosis not present

## 2023-02-11 DIAGNOSIS — M9903 Segmental and somatic dysfunction of lumbar region: Secondary | ICD-10-CM | POA: Diagnosis not present

## 2023-02-11 DIAGNOSIS — M5136 Other intervertebral disc degeneration, lumbar region: Secondary | ICD-10-CM | POA: Diagnosis not present

## 2023-02-16 DIAGNOSIS — M5136 Other intervertebral disc degeneration, lumbar region: Secondary | ICD-10-CM | POA: Diagnosis not present

## 2023-02-16 DIAGNOSIS — M9905 Segmental and somatic dysfunction of pelvic region: Secondary | ICD-10-CM | POA: Diagnosis not present

## 2023-02-16 DIAGNOSIS — M9904 Segmental and somatic dysfunction of sacral region: Secondary | ICD-10-CM | POA: Diagnosis not present

## 2023-02-16 DIAGNOSIS — M9903 Segmental and somatic dysfunction of lumbar region: Secondary | ICD-10-CM | POA: Diagnosis not present

## 2023-02-23 DIAGNOSIS — M9904 Segmental and somatic dysfunction of sacral region: Secondary | ICD-10-CM | POA: Diagnosis not present

## 2023-02-23 DIAGNOSIS — M9903 Segmental and somatic dysfunction of lumbar region: Secondary | ICD-10-CM | POA: Diagnosis not present

## 2023-02-23 DIAGNOSIS — M9905 Segmental and somatic dysfunction of pelvic region: Secondary | ICD-10-CM | POA: Diagnosis not present

## 2023-02-23 DIAGNOSIS — M5136 Other intervertebral disc degeneration, lumbar region: Secondary | ICD-10-CM | POA: Diagnosis not present

## 2023-03-02 DIAGNOSIS — M9904 Segmental and somatic dysfunction of sacral region: Secondary | ICD-10-CM | POA: Diagnosis not present

## 2023-03-02 DIAGNOSIS — M5136 Other intervertebral disc degeneration, lumbar region: Secondary | ICD-10-CM | POA: Diagnosis not present

## 2023-03-02 DIAGNOSIS — M9905 Segmental and somatic dysfunction of pelvic region: Secondary | ICD-10-CM | POA: Diagnosis not present

## 2023-03-02 DIAGNOSIS — M9903 Segmental and somatic dysfunction of lumbar region: Secondary | ICD-10-CM | POA: Diagnosis not present

## 2023-03-25 DIAGNOSIS — Z1389 Encounter for screening for other disorder: Secondary | ICD-10-CM | POA: Diagnosis not present

## 2023-03-25 DIAGNOSIS — Z1212 Encounter for screening for malignant neoplasm of rectum: Secondary | ICD-10-CM | POA: Diagnosis not present

## 2023-03-25 DIAGNOSIS — Z0189 Encounter for other specified special examinations: Secondary | ICD-10-CM | POA: Diagnosis not present

## 2023-03-25 DIAGNOSIS — E785 Hyperlipidemia, unspecified: Secondary | ICD-10-CM | POA: Diagnosis not present

## 2023-03-25 DIAGNOSIS — Z0000 Encounter for general adult medical examination without abnormal findings: Secondary | ICD-10-CM | POA: Diagnosis not present

## 2023-03-29 DIAGNOSIS — M5134 Other intervertebral disc degeneration, thoracic region: Secondary | ICD-10-CM | POA: Diagnosis not present

## 2023-03-29 DIAGNOSIS — M9902 Segmental and somatic dysfunction of thoracic region: Secondary | ICD-10-CM | POA: Diagnosis not present

## 2023-03-31 DIAGNOSIS — Z0000 Encounter for general adult medical examination without abnormal findings: Secondary | ICD-10-CM | POA: Diagnosis not present

## 2023-03-31 DIAGNOSIS — E663 Overweight: Secondary | ICD-10-CM | POA: Diagnosis not present

## 2023-03-31 DIAGNOSIS — E785 Hyperlipidemia, unspecified: Secondary | ICD-10-CM | POA: Diagnosis not present

## 2023-03-31 DIAGNOSIS — Z1339 Encounter for screening examination for other mental health and behavioral disorders: Secondary | ICD-10-CM | POA: Diagnosis not present

## 2023-03-31 DIAGNOSIS — Z23 Encounter for immunization: Secondary | ICD-10-CM | POA: Diagnosis not present

## 2023-03-31 DIAGNOSIS — Z1331 Encounter for screening for depression: Secondary | ICD-10-CM | POA: Diagnosis not present

## 2023-03-31 DIAGNOSIS — R4589 Other symptoms and signs involving emotional state: Secondary | ICD-10-CM | POA: Diagnosis not present

## 2023-03-31 DIAGNOSIS — R82998 Other abnormal findings in urine: Secondary | ICD-10-CM | POA: Diagnosis not present

## 2023-03-31 DIAGNOSIS — R5383 Other fatigue: Secondary | ICD-10-CM | POA: Diagnosis not present

## 2023-04-07 DIAGNOSIS — M9902 Segmental and somatic dysfunction of thoracic region: Secondary | ICD-10-CM | POA: Diagnosis not present

## 2023-04-07 DIAGNOSIS — M5134 Other intervertebral disc degeneration, thoracic region: Secondary | ICD-10-CM | POA: Diagnosis not present

## 2023-04-13 DIAGNOSIS — M9902 Segmental and somatic dysfunction of thoracic region: Secondary | ICD-10-CM | POA: Diagnosis not present

## 2023-04-13 DIAGNOSIS — M5134 Other intervertebral disc degeneration, thoracic region: Secondary | ICD-10-CM | POA: Diagnosis not present

## 2023-04-22 DIAGNOSIS — D2272 Melanocytic nevi of left lower limb, including hip: Secondary | ICD-10-CM | POA: Diagnosis not present

## 2023-04-22 DIAGNOSIS — Z85828 Personal history of other malignant neoplasm of skin: Secondary | ICD-10-CM | POA: Diagnosis not present

## 2023-04-22 DIAGNOSIS — B079 Viral wart, unspecified: Secondary | ICD-10-CM | POA: Diagnosis not present

## 2023-04-22 DIAGNOSIS — L578 Other skin changes due to chronic exposure to nonionizing radiation: Secondary | ICD-10-CM | POA: Diagnosis not present

## 2023-04-22 DIAGNOSIS — Z86018 Personal history of other benign neoplasm: Secondary | ICD-10-CM | POA: Diagnosis not present

## 2023-04-22 DIAGNOSIS — D485 Neoplasm of uncertain behavior of skin: Secondary | ICD-10-CM | POA: Diagnosis not present

## 2023-04-22 DIAGNOSIS — D225 Melanocytic nevi of trunk: Secondary | ICD-10-CM | POA: Diagnosis not present

## 2023-04-22 DIAGNOSIS — D2261 Melanocytic nevi of right upper limb, including shoulder: Secondary | ICD-10-CM | POA: Diagnosis not present

## 2023-04-22 DIAGNOSIS — L821 Other seborrheic keratosis: Secondary | ICD-10-CM | POA: Diagnosis not present

## 2023-04-22 DIAGNOSIS — L570 Actinic keratosis: Secondary | ICD-10-CM | POA: Diagnosis not present

## 2023-05-03 DIAGNOSIS — M5134 Other intervertebral disc degeneration, thoracic region: Secondary | ICD-10-CM | POA: Diagnosis not present

## 2023-05-03 DIAGNOSIS — M9902 Segmental and somatic dysfunction of thoracic region: Secondary | ICD-10-CM | POA: Diagnosis not present

## 2023-06-07 DIAGNOSIS — M9904 Segmental and somatic dysfunction of sacral region: Secondary | ICD-10-CM | POA: Diagnosis not present

## 2023-06-07 DIAGNOSIS — M9905 Segmental and somatic dysfunction of pelvic region: Secondary | ICD-10-CM | POA: Diagnosis not present

## 2023-06-07 DIAGNOSIS — M51361 Other intervertebral disc degeneration, lumbar region with lower extremity pain only: Secondary | ICD-10-CM | POA: Diagnosis not present

## 2023-06-07 DIAGNOSIS — M9903 Segmental and somatic dysfunction of lumbar region: Secondary | ICD-10-CM | POA: Diagnosis not present

## 2023-06-08 DIAGNOSIS — M9905 Segmental and somatic dysfunction of pelvic region: Secondary | ICD-10-CM | POA: Diagnosis not present

## 2023-06-08 DIAGNOSIS — M9903 Segmental and somatic dysfunction of lumbar region: Secondary | ICD-10-CM | POA: Diagnosis not present

## 2023-06-08 DIAGNOSIS — M9904 Segmental and somatic dysfunction of sacral region: Secondary | ICD-10-CM | POA: Diagnosis not present

## 2023-06-08 DIAGNOSIS — M51361 Other intervertebral disc degeneration, lumbar region with lower extremity pain only: Secondary | ICD-10-CM | POA: Diagnosis not present

## 2023-06-09 DIAGNOSIS — M9904 Segmental and somatic dysfunction of sacral region: Secondary | ICD-10-CM | POA: Diagnosis not present

## 2023-06-09 DIAGNOSIS — M9905 Segmental and somatic dysfunction of pelvic region: Secondary | ICD-10-CM | POA: Diagnosis not present

## 2023-06-09 DIAGNOSIS — M9903 Segmental and somatic dysfunction of lumbar region: Secondary | ICD-10-CM | POA: Diagnosis not present

## 2023-06-09 DIAGNOSIS — M51361 Other intervertebral disc degeneration, lumbar region with lower extremity pain only: Secondary | ICD-10-CM | POA: Diagnosis not present

## 2023-06-17 DIAGNOSIS — G43909 Migraine, unspecified, not intractable, without status migrainosus: Secondary | ICD-10-CM | POA: Diagnosis not present

## 2023-06-17 DIAGNOSIS — R051 Acute cough: Secondary | ICD-10-CM | POA: Diagnosis not present

## 2023-06-17 DIAGNOSIS — R509 Fever, unspecified: Secondary | ICD-10-CM | POA: Diagnosis not present

## 2023-06-17 DIAGNOSIS — Z1152 Encounter for screening for COVID-19: Secondary | ICD-10-CM | POA: Diagnosis not present

## 2023-06-17 DIAGNOSIS — J392 Other diseases of pharynx: Secondary | ICD-10-CM | POA: Diagnosis not present

## 2023-06-17 DIAGNOSIS — R0981 Nasal congestion: Secondary | ICD-10-CM | POA: Diagnosis not present

## 2023-06-17 DIAGNOSIS — R5383 Other fatigue: Secondary | ICD-10-CM | POA: Diagnosis not present

## 2023-06-17 DIAGNOSIS — J069 Acute upper respiratory infection, unspecified: Secondary | ICD-10-CM | POA: Diagnosis not present

## 2023-06-18 DIAGNOSIS — Z1389 Encounter for screening for other disorder: Secondary | ICD-10-CM | POA: Diagnosis not present

## 2023-07-06 DIAGNOSIS — M51361 Other intervertebral disc degeneration, lumbar region with lower extremity pain only: Secondary | ICD-10-CM | POA: Diagnosis not present

## 2023-07-06 DIAGNOSIS — M9903 Segmental and somatic dysfunction of lumbar region: Secondary | ICD-10-CM | POA: Diagnosis not present

## 2023-07-06 DIAGNOSIS — M9905 Segmental and somatic dysfunction of pelvic region: Secondary | ICD-10-CM | POA: Diagnosis not present

## 2023-07-06 DIAGNOSIS — M9904 Segmental and somatic dysfunction of sacral region: Secondary | ICD-10-CM | POA: Diagnosis not present

## 2023-07-20 DIAGNOSIS — M51361 Other intervertebral disc degeneration, lumbar region with lower extremity pain only: Secondary | ICD-10-CM | POA: Diagnosis not present

## 2023-07-20 DIAGNOSIS — M9905 Segmental and somatic dysfunction of pelvic region: Secondary | ICD-10-CM | POA: Diagnosis not present

## 2023-07-20 DIAGNOSIS — M9903 Segmental and somatic dysfunction of lumbar region: Secondary | ICD-10-CM | POA: Diagnosis not present

## 2023-07-20 DIAGNOSIS — M9904 Segmental and somatic dysfunction of sacral region: Secondary | ICD-10-CM | POA: Diagnosis not present

## 2023-07-21 DIAGNOSIS — M51361 Other intervertebral disc degeneration, lumbar region with lower extremity pain only: Secondary | ICD-10-CM | POA: Diagnosis not present

## 2023-07-21 DIAGNOSIS — M9904 Segmental and somatic dysfunction of sacral region: Secondary | ICD-10-CM | POA: Diagnosis not present

## 2023-07-21 DIAGNOSIS — M9903 Segmental and somatic dysfunction of lumbar region: Secondary | ICD-10-CM | POA: Diagnosis not present

## 2023-07-21 DIAGNOSIS — M9905 Segmental and somatic dysfunction of pelvic region: Secondary | ICD-10-CM | POA: Diagnosis not present

## 2023-07-22 DIAGNOSIS — M9903 Segmental and somatic dysfunction of lumbar region: Secondary | ICD-10-CM | POA: Diagnosis not present

## 2023-07-22 DIAGNOSIS — M51361 Other intervertebral disc degeneration, lumbar region with lower extremity pain only: Secondary | ICD-10-CM | POA: Diagnosis not present

## 2023-07-22 DIAGNOSIS — M9905 Segmental and somatic dysfunction of pelvic region: Secondary | ICD-10-CM | POA: Diagnosis not present

## 2023-07-22 DIAGNOSIS — M9904 Segmental and somatic dysfunction of sacral region: Secondary | ICD-10-CM | POA: Diagnosis not present

## 2023-07-29 DIAGNOSIS — M9903 Segmental and somatic dysfunction of lumbar region: Secondary | ICD-10-CM | POA: Diagnosis not present

## 2023-07-29 DIAGNOSIS — M51361 Other intervertebral disc degeneration, lumbar region with lower extremity pain only: Secondary | ICD-10-CM | POA: Diagnosis not present

## 2023-07-29 DIAGNOSIS — M9905 Segmental and somatic dysfunction of pelvic region: Secondary | ICD-10-CM | POA: Diagnosis not present

## 2023-07-29 DIAGNOSIS — M9904 Segmental and somatic dysfunction of sacral region: Secondary | ICD-10-CM | POA: Diagnosis not present

## 2023-08-30 DIAGNOSIS — M9903 Segmental and somatic dysfunction of lumbar region: Secondary | ICD-10-CM | POA: Diagnosis not present

## 2023-08-30 DIAGNOSIS — M9905 Segmental and somatic dysfunction of pelvic region: Secondary | ICD-10-CM | POA: Diagnosis not present

## 2023-08-30 DIAGNOSIS — M9904 Segmental and somatic dysfunction of sacral region: Secondary | ICD-10-CM | POA: Diagnosis not present

## 2023-08-30 DIAGNOSIS — M51361 Other intervertebral disc degeneration, lumbar region with lower extremity pain only: Secondary | ICD-10-CM | POA: Diagnosis not present

## 2023-09-06 DIAGNOSIS — M9903 Segmental and somatic dysfunction of lumbar region: Secondary | ICD-10-CM | POA: Diagnosis not present

## 2023-09-06 DIAGNOSIS — M9905 Segmental and somatic dysfunction of pelvic region: Secondary | ICD-10-CM | POA: Diagnosis not present

## 2023-09-06 DIAGNOSIS — M51361 Other intervertebral disc degeneration, lumbar region with lower extremity pain only: Secondary | ICD-10-CM | POA: Diagnosis not present

## 2023-09-06 DIAGNOSIS — M9904 Segmental and somatic dysfunction of sacral region: Secondary | ICD-10-CM | POA: Diagnosis not present

## 2023-09-09 DIAGNOSIS — M9904 Segmental and somatic dysfunction of sacral region: Secondary | ICD-10-CM | POA: Diagnosis not present

## 2023-09-09 DIAGNOSIS — M9903 Segmental and somatic dysfunction of lumbar region: Secondary | ICD-10-CM | POA: Diagnosis not present

## 2023-09-09 DIAGNOSIS — M51361 Other intervertebral disc degeneration, lumbar region with lower extremity pain only: Secondary | ICD-10-CM | POA: Diagnosis not present

## 2023-09-09 DIAGNOSIS — M9905 Segmental and somatic dysfunction of pelvic region: Secondary | ICD-10-CM | POA: Diagnosis not present

## 2023-09-15 DIAGNOSIS — M9904 Segmental and somatic dysfunction of sacral region: Secondary | ICD-10-CM | POA: Diagnosis not present

## 2023-09-15 DIAGNOSIS — M51361 Other intervertebral disc degeneration, lumbar region with lower extremity pain only: Secondary | ICD-10-CM | POA: Diagnosis not present

## 2023-09-15 DIAGNOSIS — M9903 Segmental and somatic dysfunction of lumbar region: Secondary | ICD-10-CM | POA: Diagnosis not present

## 2023-09-15 DIAGNOSIS — M9905 Segmental and somatic dysfunction of pelvic region: Secondary | ICD-10-CM | POA: Diagnosis not present

## 2023-09-20 DIAGNOSIS — H2513 Age-related nuclear cataract, bilateral: Secondary | ICD-10-CM | POA: Diagnosis not present

## 2023-09-20 DIAGNOSIS — H524 Presbyopia: Secondary | ICD-10-CM | POA: Diagnosis not present

## 2023-09-20 DIAGNOSIS — H5203 Hypermetropia, bilateral: Secondary | ICD-10-CM | POA: Diagnosis not present

## 2023-09-20 DIAGNOSIS — H43813 Vitreous degeneration, bilateral: Secondary | ICD-10-CM | POA: Diagnosis not present

## 2023-09-21 DIAGNOSIS — M9903 Segmental and somatic dysfunction of lumbar region: Secondary | ICD-10-CM | POA: Diagnosis not present

## 2023-09-21 DIAGNOSIS — M9904 Segmental and somatic dysfunction of sacral region: Secondary | ICD-10-CM | POA: Diagnosis not present

## 2023-09-21 DIAGNOSIS — M9905 Segmental and somatic dysfunction of pelvic region: Secondary | ICD-10-CM | POA: Diagnosis not present

## 2023-09-21 DIAGNOSIS — M51361 Other intervertebral disc degeneration, lumbar region with lower extremity pain only: Secondary | ICD-10-CM | POA: Diagnosis not present

## 2023-09-27 DIAGNOSIS — E669 Obesity, unspecified: Secondary | ICD-10-CM | POA: Diagnosis not present

## 2023-09-27 DIAGNOSIS — Z6831 Body mass index (BMI) 31.0-31.9, adult: Secondary | ICD-10-CM | POA: Diagnosis not present

## 2023-09-27 DIAGNOSIS — M9904 Segmental and somatic dysfunction of sacral region: Secondary | ICD-10-CM | POA: Diagnosis not present

## 2023-09-27 DIAGNOSIS — E785 Hyperlipidemia, unspecified: Secondary | ICD-10-CM | POA: Diagnosis not present

## 2023-09-27 DIAGNOSIS — M9905 Segmental and somatic dysfunction of pelvic region: Secondary | ICD-10-CM | POA: Diagnosis not present

## 2023-09-27 DIAGNOSIS — M9903 Segmental and somatic dysfunction of lumbar region: Secondary | ICD-10-CM | POA: Diagnosis not present

## 2023-09-27 DIAGNOSIS — K588 Other irritable bowel syndrome: Secondary | ICD-10-CM | POA: Diagnosis not present

## 2023-09-27 DIAGNOSIS — H9113 Presbycusis, bilateral: Secondary | ICD-10-CM | POA: Diagnosis not present

## 2023-09-27 DIAGNOSIS — M51361 Other intervertebral disc degeneration, lumbar region with lower extremity pain only: Secondary | ICD-10-CM | POA: Diagnosis not present

## 2023-09-30 DIAGNOSIS — M51361 Other intervertebral disc degeneration, lumbar region with lower extremity pain only: Secondary | ICD-10-CM | POA: Diagnosis not present

## 2023-09-30 DIAGNOSIS — M9903 Segmental and somatic dysfunction of lumbar region: Secondary | ICD-10-CM | POA: Diagnosis not present

## 2023-09-30 DIAGNOSIS — M9905 Segmental and somatic dysfunction of pelvic region: Secondary | ICD-10-CM | POA: Diagnosis not present

## 2023-09-30 DIAGNOSIS — M9904 Segmental and somatic dysfunction of sacral region: Secondary | ICD-10-CM | POA: Diagnosis not present

## 2023-10-01 DIAGNOSIS — Z1231 Encounter for screening mammogram for malignant neoplasm of breast: Secondary | ICD-10-CM | POA: Diagnosis not present

## 2023-10-11 DIAGNOSIS — M9905 Segmental and somatic dysfunction of pelvic region: Secondary | ICD-10-CM | POA: Diagnosis not present

## 2023-10-11 DIAGNOSIS — M9904 Segmental and somatic dysfunction of sacral region: Secondary | ICD-10-CM | POA: Diagnosis not present

## 2023-10-11 DIAGNOSIS — M9903 Segmental and somatic dysfunction of lumbar region: Secondary | ICD-10-CM | POA: Diagnosis not present

## 2023-10-11 DIAGNOSIS — M51361 Other intervertebral disc degeneration, lumbar region with lower extremity pain only: Secondary | ICD-10-CM | POA: Diagnosis not present

## 2023-10-14 DIAGNOSIS — M51361 Other intervertebral disc degeneration, lumbar region with lower extremity pain only: Secondary | ICD-10-CM | POA: Diagnosis not present

## 2023-10-14 DIAGNOSIS — M9903 Segmental and somatic dysfunction of lumbar region: Secondary | ICD-10-CM | POA: Diagnosis not present

## 2023-10-14 DIAGNOSIS — M9905 Segmental and somatic dysfunction of pelvic region: Secondary | ICD-10-CM | POA: Diagnosis not present

## 2023-10-14 DIAGNOSIS — M9904 Segmental and somatic dysfunction of sacral region: Secondary | ICD-10-CM | POA: Diagnosis not present

## 2023-10-19 DIAGNOSIS — M51361 Other intervertebral disc degeneration, lumbar region with lower extremity pain only: Secondary | ICD-10-CM | POA: Diagnosis not present

## 2023-10-19 DIAGNOSIS — M9904 Segmental and somatic dysfunction of sacral region: Secondary | ICD-10-CM | POA: Diagnosis not present

## 2023-10-19 DIAGNOSIS — M9903 Segmental and somatic dysfunction of lumbar region: Secondary | ICD-10-CM | POA: Diagnosis not present

## 2023-10-19 DIAGNOSIS — M9905 Segmental and somatic dysfunction of pelvic region: Secondary | ICD-10-CM | POA: Diagnosis not present

## 2023-10-25 DIAGNOSIS — M51361 Other intervertebral disc degeneration, lumbar region with lower extremity pain only: Secondary | ICD-10-CM | POA: Diagnosis not present

## 2023-10-25 DIAGNOSIS — M9905 Segmental and somatic dysfunction of pelvic region: Secondary | ICD-10-CM | POA: Diagnosis not present

## 2023-10-25 DIAGNOSIS — M9903 Segmental and somatic dysfunction of lumbar region: Secondary | ICD-10-CM | POA: Diagnosis not present

## 2023-10-25 DIAGNOSIS — M9904 Segmental and somatic dysfunction of sacral region: Secondary | ICD-10-CM | POA: Diagnosis not present

## 2023-11-22 DIAGNOSIS — M5136 Other intervertebral disc degeneration, lumbar region with discogenic back pain only: Secondary | ICD-10-CM | POA: Diagnosis not present

## 2023-11-22 DIAGNOSIS — M9905 Segmental and somatic dysfunction of pelvic region: Secondary | ICD-10-CM | POA: Diagnosis not present

## 2023-11-22 DIAGNOSIS — M9904 Segmental and somatic dysfunction of sacral region: Secondary | ICD-10-CM | POA: Diagnosis not present

## 2023-11-22 DIAGNOSIS — M9903 Segmental and somatic dysfunction of lumbar region: Secondary | ICD-10-CM | POA: Diagnosis not present

## 2023-11-26 DIAGNOSIS — K588 Other irritable bowel syndrome: Secondary | ICD-10-CM | POA: Diagnosis not present

## 2023-11-26 DIAGNOSIS — K5792 Diverticulitis of intestine, part unspecified, without perforation or abscess without bleeding: Secondary | ICD-10-CM | POA: Diagnosis not present

## 2023-11-26 DIAGNOSIS — R1032 Left lower quadrant pain: Secondary | ICD-10-CM | POA: Diagnosis not present

## 2023-11-26 DIAGNOSIS — M5441 Lumbago with sciatica, right side: Secondary | ICD-10-CM | POA: Diagnosis not present

## 2023-11-29 DIAGNOSIS — M5136 Other intervertebral disc degeneration, lumbar region with discogenic back pain only: Secondary | ICD-10-CM | POA: Diagnosis not present

## 2023-11-29 DIAGNOSIS — M9903 Segmental and somatic dysfunction of lumbar region: Secondary | ICD-10-CM | POA: Diagnosis not present

## 2023-11-29 DIAGNOSIS — M9905 Segmental and somatic dysfunction of pelvic region: Secondary | ICD-10-CM | POA: Diagnosis not present

## 2023-11-29 DIAGNOSIS — M9904 Segmental and somatic dysfunction of sacral region: Secondary | ICD-10-CM | POA: Diagnosis not present

## 2023-12-20 DIAGNOSIS — R058 Other specified cough: Secondary | ICD-10-CM | POA: Diagnosis not present

## 2023-12-20 DIAGNOSIS — Z1152 Encounter for screening for COVID-19: Secondary | ICD-10-CM | POA: Diagnosis not present

## 2023-12-20 DIAGNOSIS — R5383 Other fatigue: Secondary | ICD-10-CM | POA: Diagnosis not present

## 2023-12-20 DIAGNOSIS — R0981 Nasal congestion: Secondary | ICD-10-CM | POA: Diagnosis not present

## 2023-12-20 DIAGNOSIS — R918 Other nonspecific abnormal finding of lung field: Secondary | ICD-10-CM | POA: Diagnosis not present

## 2024-01-11 DIAGNOSIS — J069 Acute upper respiratory infection, unspecified: Secondary | ICD-10-CM | POA: Diagnosis not present

## 2024-01-24 DIAGNOSIS — M9905 Segmental and somatic dysfunction of pelvic region: Secondary | ICD-10-CM | POA: Diagnosis not present

## 2024-01-24 DIAGNOSIS — M5136 Other intervertebral disc degeneration, lumbar region with discogenic back pain only: Secondary | ICD-10-CM | POA: Diagnosis not present

## 2024-01-24 DIAGNOSIS — M9903 Segmental and somatic dysfunction of lumbar region: Secondary | ICD-10-CM | POA: Diagnosis not present

## 2024-01-24 DIAGNOSIS — M9904 Segmental and somatic dysfunction of sacral region: Secondary | ICD-10-CM | POA: Diagnosis not present

## 2024-01-27 DIAGNOSIS — M5136 Other intervertebral disc degeneration, lumbar region with discogenic back pain only: Secondary | ICD-10-CM | POA: Diagnosis not present

## 2024-01-27 DIAGNOSIS — M9905 Segmental and somatic dysfunction of pelvic region: Secondary | ICD-10-CM | POA: Diagnosis not present

## 2024-01-27 DIAGNOSIS — M9903 Segmental and somatic dysfunction of lumbar region: Secondary | ICD-10-CM | POA: Diagnosis not present

## 2024-01-27 DIAGNOSIS — M9904 Segmental and somatic dysfunction of sacral region: Secondary | ICD-10-CM | POA: Diagnosis not present

## 2024-02-22 DIAGNOSIS — R1032 Left lower quadrant pain: Secondary | ICD-10-CM | POA: Diagnosis not present

## 2024-02-22 DIAGNOSIS — R194 Change in bowel habit: Secondary | ICD-10-CM | POA: Diagnosis not present

## 2024-02-23 ENCOUNTER — Other Ambulatory Visit: Payer: Self-pay | Admitting: Internal Medicine

## 2024-02-23 DIAGNOSIS — K5792 Diverticulitis of intestine, part unspecified, without perforation or abscess without bleeding: Secondary | ICD-10-CM

## 2024-02-23 DIAGNOSIS — R109 Unspecified abdominal pain: Secondary | ICD-10-CM

## 2024-02-25 ENCOUNTER — Ambulatory Visit
Admission: RE | Admit: 2024-02-25 | Discharge: 2024-02-25 | Disposition: A | Discharge status: Home / Self Care | Source: Ambulatory Visit | Attending: Internal Medicine | Admitting: Internal Medicine

## 2024-02-25 ENCOUNTER — Encounter: Payer: Self-pay | Admitting: Radiology

## 2024-02-25 DIAGNOSIS — K5730 Diverticulosis of large intestine without perforation or abscess without bleeding: Secondary | ICD-10-CM | POA: Diagnosis not present

## 2024-02-25 DIAGNOSIS — K5792 Diverticulitis of intestine, part unspecified, without perforation or abscess without bleeding: Secondary | ICD-10-CM

## 2024-02-25 DIAGNOSIS — R109 Unspecified abdominal pain: Secondary | ICD-10-CM

## 2024-02-25 MED ORDER — IOPAMIDOL (ISOVUE-300) INJECTION 61%
100.0000 mL | Freq: Once | INTRAVENOUS | Status: AC | PRN
Start: 2024-02-25 — End: 2024-02-25
  Administered 2024-02-25: 100 mL via INTRAVENOUS

## 2024-02-29 DIAGNOSIS — M5136 Other intervertebral disc degeneration, lumbar region with discogenic back pain only: Secondary | ICD-10-CM | POA: Diagnosis not present

## 2024-02-29 DIAGNOSIS — M9905 Segmental and somatic dysfunction of pelvic region: Secondary | ICD-10-CM | POA: Diagnosis not present

## 2024-02-29 DIAGNOSIS — M9903 Segmental and somatic dysfunction of lumbar region: Secondary | ICD-10-CM | POA: Diagnosis not present

## 2024-02-29 DIAGNOSIS — M9904 Segmental and somatic dysfunction of sacral region: Secondary | ICD-10-CM | POA: Diagnosis not present

## 2024-04-04 DIAGNOSIS — Z0189 Encounter for other specified special examinations: Secondary | ICD-10-CM | POA: Diagnosis not present

## 2024-04-04 DIAGNOSIS — Z79899 Other long term (current) drug therapy: Secondary | ICD-10-CM | POA: Diagnosis not present

## 2024-04-04 DIAGNOSIS — E785 Hyperlipidemia, unspecified: Secondary | ICD-10-CM | POA: Diagnosis not present

## 2024-04-04 DIAGNOSIS — Z1212 Encounter for screening for malignant neoplasm of rectum: Secondary | ICD-10-CM | POA: Diagnosis not present

## 2024-04-05 ENCOUNTER — Ambulatory Visit: Admitting: Obstetrics and Gynecology

## 2024-04-06 DIAGNOSIS — M9903 Segmental and somatic dysfunction of lumbar region: Secondary | ICD-10-CM | POA: Diagnosis not present

## 2024-04-06 DIAGNOSIS — M9904 Segmental and somatic dysfunction of sacral region: Secondary | ICD-10-CM | POA: Diagnosis not present

## 2024-04-06 DIAGNOSIS — M5136 Other intervertebral disc degeneration, lumbar region with discogenic back pain only: Secondary | ICD-10-CM | POA: Diagnosis not present

## 2024-04-06 DIAGNOSIS — M9905 Segmental and somatic dysfunction of pelvic region: Secondary | ICD-10-CM | POA: Diagnosis not present

## 2024-04-12 DIAGNOSIS — R82998 Other abnormal findings in urine: Secondary | ICD-10-CM | POA: Diagnosis not present

## 2024-04-12 DIAGNOSIS — Z23 Encounter for immunization: Secondary | ICD-10-CM | POA: Diagnosis not present

## 2024-04-12 DIAGNOSIS — R4589 Other symptoms and signs involving emotional state: Secondary | ICD-10-CM | POA: Diagnosis not present

## 2024-04-12 DIAGNOSIS — Z0000 Encounter for general adult medical examination without abnormal findings: Secondary | ICD-10-CM | POA: Diagnosis not present

## 2024-04-12 DIAGNOSIS — E785 Hyperlipidemia, unspecified: Secondary | ICD-10-CM | POA: Diagnosis not present

## 2024-04-12 DIAGNOSIS — Z1339 Encounter for screening examination for other mental health and behavioral disorders: Secondary | ICD-10-CM | POA: Diagnosis not present

## 2024-04-12 DIAGNOSIS — E669 Obesity, unspecified: Secondary | ICD-10-CM | POA: Diagnosis not present

## 2024-04-12 DIAGNOSIS — Z1331 Encounter for screening for depression: Secondary | ICD-10-CM | POA: Diagnosis not present

## 2024-04-12 DIAGNOSIS — H9113 Presbycusis, bilateral: Secondary | ICD-10-CM | POA: Diagnosis not present

## 2024-04-24 DIAGNOSIS — M5136 Other intervertebral disc degeneration, lumbar region with discogenic back pain only: Secondary | ICD-10-CM | POA: Diagnosis not present

## 2024-04-24 DIAGNOSIS — M9905 Segmental and somatic dysfunction of pelvic region: Secondary | ICD-10-CM | POA: Diagnosis not present

## 2024-04-24 DIAGNOSIS — M9904 Segmental and somatic dysfunction of sacral region: Secondary | ICD-10-CM | POA: Diagnosis not present

## 2024-04-24 DIAGNOSIS — M9903 Segmental and somatic dysfunction of lumbar region: Secondary | ICD-10-CM | POA: Diagnosis not present

## 2024-04-26 DIAGNOSIS — M9903 Segmental and somatic dysfunction of lumbar region: Secondary | ICD-10-CM | POA: Diagnosis not present

## 2024-04-26 DIAGNOSIS — M5136 Other intervertebral disc degeneration, lumbar region with discogenic back pain only: Secondary | ICD-10-CM | POA: Diagnosis not present

## 2024-04-26 DIAGNOSIS — M9905 Segmental and somatic dysfunction of pelvic region: Secondary | ICD-10-CM | POA: Diagnosis not present

## 2024-04-26 DIAGNOSIS — M9904 Segmental and somatic dysfunction of sacral region: Secondary | ICD-10-CM | POA: Diagnosis not present

## 2024-04-28 DIAGNOSIS — D485 Neoplasm of uncertain behavior of skin: Secondary | ICD-10-CM | POA: Diagnosis not present

## 2024-04-28 DIAGNOSIS — L821 Other seborrheic keratosis: Secondary | ICD-10-CM | POA: Diagnosis not present

## 2024-04-28 DIAGNOSIS — L570 Actinic keratosis: Secondary | ICD-10-CM | POA: Diagnosis not present

## 2024-04-28 DIAGNOSIS — D2272 Melanocytic nevi of left lower limb, including hip: Secondary | ICD-10-CM | POA: Diagnosis not present

## 2024-04-28 DIAGNOSIS — D2261 Melanocytic nevi of right upper limb, including shoulder: Secondary | ICD-10-CM | POA: Diagnosis not present

## 2024-04-28 DIAGNOSIS — Z86018 Personal history of other benign neoplasm: Secondary | ICD-10-CM | POA: Diagnosis not present

## 2024-04-28 DIAGNOSIS — D044 Carcinoma in situ of skin of scalp and neck: Secondary | ICD-10-CM | POA: Diagnosis not present

## 2024-04-28 DIAGNOSIS — L578 Other skin changes due to chronic exposure to nonionizing radiation: Secondary | ICD-10-CM | POA: Diagnosis not present

## 2024-04-28 DIAGNOSIS — L720 Epidermal cyst: Secondary | ICD-10-CM | POA: Diagnosis not present

## 2024-04-28 DIAGNOSIS — Z85828 Personal history of other malignant neoplasm of skin: Secondary | ICD-10-CM | POA: Diagnosis not present

## 2024-04-28 DIAGNOSIS — D225 Melanocytic nevi of trunk: Secondary | ICD-10-CM | POA: Diagnosis not present

## 2024-05-06 ENCOUNTER — Emergency Department (HOSPITAL_BASED_OUTPATIENT_CLINIC_OR_DEPARTMENT_OTHER)

## 2024-05-06 ENCOUNTER — Encounter (HOSPITAL_BASED_OUTPATIENT_CLINIC_OR_DEPARTMENT_OTHER): Payer: Self-pay | Admitting: Emergency Medicine

## 2024-05-06 ENCOUNTER — Emergency Department (HOSPITAL_BASED_OUTPATIENT_CLINIC_OR_DEPARTMENT_OTHER)
Admission: EM | Admit: 2024-05-06 | Discharge: 2024-05-06 | Disposition: A | Discharge status: Home / Self Care | Attending: Emergency Medicine | Admitting: Emergency Medicine

## 2024-05-06 ENCOUNTER — Other Ambulatory Visit: Payer: Self-pay

## 2024-05-06 DIAGNOSIS — S60416A Abrasion of right little finger, initial encounter: Secondary | ICD-10-CM | POA: Insufficient documentation

## 2024-05-06 DIAGNOSIS — S0083XA Contusion of other part of head, initial encounter: Secondary | ICD-10-CM | POA: Insufficient documentation

## 2024-05-06 DIAGNOSIS — S0990XA Unspecified injury of head, initial encounter: Secondary | ICD-10-CM | POA: Diagnosis not present

## 2024-05-06 DIAGNOSIS — Y92480 Sidewalk as the place of occurrence of the external cause: Secondary | ICD-10-CM | POA: Insufficient documentation

## 2024-05-06 DIAGNOSIS — S0093XA Contusion of unspecified part of head, initial encounter: Secondary | ICD-10-CM

## 2024-05-06 DIAGNOSIS — Y9301 Activity, walking, marching and hiking: Secondary | ICD-10-CM | POA: Insufficient documentation

## 2024-05-06 DIAGNOSIS — S0003XA Contusion of scalp, initial encounter: Secondary | ICD-10-CM | POA: Diagnosis not present

## 2024-05-06 DIAGNOSIS — S80211A Abrasion, right knee, initial encounter: Secondary | ICD-10-CM | POA: Diagnosis not present

## 2024-05-06 DIAGNOSIS — R220 Localized swelling, mass and lump, head: Secondary | ICD-10-CM | POA: Diagnosis not present

## 2024-05-06 DIAGNOSIS — W101XXA Fall (on)(from) sidewalk curb, initial encounter: Secondary | ICD-10-CM | POA: Insufficient documentation

## 2024-05-06 MED ORDER — BACITRACIN ZINC 500 UNIT/GM EX OINT: TOPICAL_OINTMENT | Freq: Once | CUTANEOUS | Status: AC

## 2024-05-06 NOTE — ED Triage Notes (Signed)
 Pt caox4, ambulatory reporting she was walking outside and tripped on the sidewalk causing her to fall and hit her head. Pt denies LOC, does not take blood thinner. Swelling, bruising above R eye, abrasion and swelling to R forehead, abrasion to R knee and R hand. Pt denies numbness/weakness in extremities. Denies visual abnormalities, dizziness, headache.

## 2024-05-06 NOTE — ED Notes (Signed)
Discharge instructions and follow up care reviewed and explained pt verbalized understanding and had no further questions on d/c.

## 2024-05-06 NOTE — ED Provider Notes (Signed)
 [place] EMERGENCY DEPARTMENT AT [facility] Provider Note   CSN: [id number] Arrival date & time: 05/06/24  9083     Patient presents with: [name]   Karen Todd is a 68 y.o. female.   Pt is a 68 yo female with pmhx significant for diverticulosis and depression.  She presents to the ED today after a mechanical fall at the [facility].  She hit her forehead.  No loc.  No n/v.  No dizziness.  No blood thinners.  She also sustained abrasions to her right hand and to her right knee.  She is able to ambulate and to move her knee and hand.  She does not feel anything is broken in these places.  Tetanus is utd.       Prior to Admission medications   Medication Sig Start Date End Date Taking? Authorizing Provider  escitalopram (LEXAPRO) 10 MG tablet Take 10 mg by mouth daily. 02/03/24  Yes [provider]  meclizine (ANTIVERT) 25 MG tablet Take 1 tablet by mouth 3 (three) times daily as needed. 10/14/20   [provider]    Allergies: Codeine, Sulfa antibiotics, and Tetracycline    Review of Systems  Skin:  Positive for wound.  Neurological:  Positive for headaches.  All other systems reviewed and are negative.   Updated Vital Signs BP (!) 148/80   Pulse 65   Temp 98 F (36.7 C) (Oral)   Resp 16   Ht 5' 5 (1.651 m)   Wt 87.5 kg   SpO2 95%   BMI 32.12 kg/m   Physical Exam Vitals and nursing note reviewed.  Constitutional:      Appearance: Normal appearance.  HENT:     Head: Normocephalic.      Comments: Abrasion and ecchymosis to right forehead and upper lid    Right Ear: External ear normal.     Left Ear: External ear normal.     Nose: Nose normal.     Mouth/Throat:     Mouth: Mucous membranes are moist.     Pharynx: Oropharynx is clear.  Eyes:     Extraocular Movements: Extraocular movements intact.     Conjunctiva/sclera: Conjunctivae normal.     Pupils: Pupils are equal, round, and reactive to light.  Cardiovascular:     Rate and  Rhythm: Normal rate and regular rhythm.     Pulses: Normal pulses.     Heart sounds: Normal heart sounds.  Pulmonary:     Effort: Pulmonary effort is normal.     Breath sounds: Normal breath sounds.  Abdominal:     General: Abdomen is flat. Bowel sounds are normal.     Palpations: Abdomen is soft.  Musculoskeletal:        General: Normal range of motion.     Cervical back: Normal range of motion and neck supple.  Skin:    General: Skin is warm.     Capillary Refill: Capillary refill takes less than 2 seconds.     Comments: Small abrasion right knee Abrasion to right 5th finger  Neurological:     General: No focal deficit present.     Mental Status: She is alert and oriented to person, place, and time.  Psychiatric:        Mood and Affect: Mood normal.        Behavior: Behavior normal.     (all labs ordered are listed, but only abnormal results are displayed) Labs Reviewed - No data to display  EKG: None  Radiology: CT Head Wo Contrast Result Date: 05/06/2024 EXAM: CT HEAD WITHOUT CONTRAST 05/06/2024 10:43:26 AM TECHNIQUE: CT of the head was performed without the administration of intravenous contrast. Automated exposure control, iterative reconstruction, and/or weight based adjustment of the mA/kV was utilized to reduce the radiation dose to as low as reasonably achievable. COMPARISON: None available. CLINICAL HISTORY: Head trauma, minor (Age >= 65y). Mechanical fall. Hit head on concrete. No loss of consciousness. FINDINGS: BRAIN AND VENTRICLES: No acute hemorrhage. No evidence of acute infarct. No hydrocephalus. No extra-axial collection. No mass effect or midline shift. ORBITS: No acute abnormality. SINUSES: No acute abnormality. SOFT TISSUES AND SKULL: Right periorbital soft tissue swelling extends into the supraorbital scalp. Right forehead contusion. No underlying fracture or foreign bodies present. IMPRESSION: 1. No acute intracranial abnormality. 2. Right periorbital soft  tissue swelling extending into the supraorbital scalp and right forehead contusion, without underlying fracture or foreign body. Electronically signed by: Lonni Necessary MD 05/06/2024 10:59 AM EST RP Workstation: [id number]     Procedures   Medications Ordered in the ED  bacitracin  ointment ( Topical Given 05/06/24 1021)                                    Medical Decision Making Amount and/or Complexity of Data Reviewed Radiology: ordered.  Risk OTC drugs.   This patient presents to the ED for concern of fall, this involves an extensive number of treatment options, and is a complaint that carries with it a high risk of complications and morbidity.  The differential diagnosis includes multiple trauma   Co morbidities that complicate the patient evaluation  diverticulosis and depression   Additional history obtained:  Additional history obtained from epic chart review External records from outside source obtained and reviewed including family   Imaging Studies ordered:  I ordered imaging studies including ct head  I independently visualized and interpreted imaging which showed  No acute intracranial abnormality.  2. Right periorbital soft tissue swelling extending into the supraorbital scalp  and right forehead contusion, without underlying fracture or foreign body.   I agree with the radiologist interpretation   Medicines ordered and prescription drug management:  I have reviewed the patients home medicines and have made adjustments as needed   Test Considered:  ct  Problem List / ED Course:  Forehead hematoma:  no internal injury.  Pt is stable for d/c.  She has no neck pain and is awake and alert without a distracting injury, so I clinically cleared her neck.   Reevaluation:  After the interventions noted above, I reevaluated the patient and found that they have :improved   Social Determinants of Health:  Lives at home   Dispostion:  After  consideration of the diagnostic results and the patients response to treatment, I feel that the patent would benefit from discharge with outpatient f/u.       Final diagnoses:  Traumatic cephalohematoma, initial encounter    ED Discharge Orders     None          Dean Clarity, MD 05/06/24 1108

## 2024-05-06 NOTE — ED Notes (Signed)
 Patient transported to CT

## 2024-05-08 DIAGNOSIS — M9905 Segmental and somatic dysfunction of pelvic region: Secondary | ICD-10-CM | POA: Diagnosis not present

## 2024-05-08 DIAGNOSIS — M9903 Segmental and somatic dysfunction of lumbar region: Secondary | ICD-10-CM | POA: Diagnosis not present

## 2024-05-08 DIAGNOSIS — M9904 Segmental and somatic dysfunction of sacral region: Secondary | ICD-10-CM | POA: Diagnosis not present

## 2024-05-08 DIAGNOSIS — M5136 Other intervertebral disc degeneration, lumbar region with discogenic back pain only: Secondary | ICD-10-CM | POA: Diagnosis not present

## 2024-05-23 DIAGNOSIS — M9905 Segmental and somatic dysfunction of pelvic region: Secondary | ICD-10-CM | POA: Diagnosis not present

## 2024-05-23 DIAGNOSIS — M9904 Segmental and somatic dysfunction of sacral region: Secondary | ICD-10-CM | POA: Diagnosis not present

## 2024-05-23 DIAGNOSIS — M9903 Segmental and somatic dysfunction of lumbar region: Secondary | ICD-10-CM | POA: Diagnosis not present

## 2024-05-23 DIAGNOSIS — M5136 Other intervertebral disc degeneration, lumbar region with discogenic back pain only: Secondary | ICD-10-CM | POA: Diagnosis not present

## 2024-06-08 DIAGNOSIS — C4442 Squamous cell carcinoma of skin of scalp and neck: Secondary | ICD-10-CM | POA: Diagnosis not present

## 2024-09-21 ENCOUNTER — Encounter: Admitting: Obstetrics and Gynecology
